# Patient Record
Sex: Female | Born: 1960
Health system: Southern US, Community
[De-identification: ages and names within clinical notes are randomized; demographics above are authoritative.]

## PROBLEM LIST (undated history)

## (undated) DIAGNOSIS — R112 Nausea with vomiting, unspecified: Secondary | ICD-10-CM

## (undated) DIAGNOSIS — F988 Other specified behavioral and emotional disorders with onset usually occurring in childhood and adolescence: Secondary | ICD-10-CM

## (undated) DIAGNOSIS — I10 Essential (primary) hypertension: Secondary | ICD-10-CM

## (undated) DIAGNOSIS — Z9889 Other specified postprocedural states: Secondary | ICD-10-CM

## (undated) DIAGNOSIS — R079 Chest pain, unspecified: Secondary | ICD-10-CM

## (undated) HISTORY — DX: Essential (primary) hypertension: I10

## (undated) HISTORY — DX: Chest pain, unspecified: R07.9

## (undated) HISTORY — DX: Other specified behavioral and emotional disorders with onset usually occurring in childhood and adolescence: F98.8

## (undated) HISTORY — PX: LAPAROSCOPY: SHX197

---
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
## 1997-11-13 ENCOUNTER — Other Ambulatory Visit: Admission: RE | Admit: 1997-11-13 | Discharge: 1997-11-13 | Payer: Self-pay | Admitting: Obstetrics and Gynecology

## 1998-09-23 ENCOUNTER — Ambulatory Visit (HOSPITAL_COMMUNITY): Admission: RE | Admit: 1998-09-23 | Discharge: 1998-09-23 | Payer: Self-pay | Admitting: Family Medicine

## 1998-09-23 ENCOUNTER — Encounter: Payer: Self-pay | Admitting: Family Medicine

## 1999-01-14 ENCOUNTER — Encounter: Payer: Self-pay | Admitting: *Deleted

## 1999-01-14 ENCOUNTER — Ambulatory Visit (HOSPITAL_COMMUNITY): Admission: RE | Admit: 1999-01-14 | Discharge: 1999-01-14 | Payer: Self-pay | Admitting: *Deleted

## 2004-05-09 ENCOUNTER — Ambulatory Visit (HOSPITAL_COMMUNITY): Admission: RE | Admit: 2004-05-09 | Discharge: 2004-05-09 | Payer: Self-pay | Admitting: Family Medicine

## 2008-08-16 ENCOUNTER — Encounter: Payer: Self-pay | Admitting: Cardiology

## 2013-03-30 ENCOUNTER — Other Ambulatory Visit: Payer: Self-pay | Admitting: Family Medicine

## 2013-03-30 ENCOUNTER — Ambulatory Visit
Admission: RE | Admit: 2013-03-30 | Discharge: 2013-03-30 | Disposition: A | Discharge status: Home / Self Care | Payer: Self-pay | Source: Ambulatory Visit | Attending: Family Medicine | Admitting: Family Medicine

## 2013-03-30 DIAGNOSIS — M25551 Pain in right hip: Secondary | ICD-10-CM

## 2013-04-27 ENCOUNTER — Encounter: Payer: Self-pay | Admitting: Internal Medicine

## 2013-06-29 ENCOUNTER — Ambulatory Visit (AMBULATORY_SURGERY_CENTER): Payer: Self-pay | Admitting: *Deleted

## 2013-06-29 ENCOUNTER — Telehealth: Payer: Self-pay | Admitting: Internal Medicine

## 2013-06-29 VITALS — Ht 70.0 in | Wt 154.4 lb

## 2013-06-29 DIAGNOSIS — Z1211 Encounter for screening for malignant neoplasm of colon: Secondary | ICD-10-CM

## 2013-06-29 MED ORDER — MOVIPREP 100 G PO SOLR: ORAL | Status: DC

## 2013-06-29 NOTE — Progress Notes (Signed)
No allergies to eggs or soy. No problems with anesthesia.  

## 2013-06-29 NOTE — Telephone Encounter (Signed)
Pharmacist is calling to tell us pt will have to pay $101.00 for MoviPrep.  Pt was aware at [facility] of cost of prep and was ok to use MoviPrep.  Pharmacist will fill Rx.

## 2013-07-13 ENCOUNTER — Ambulatory Visit (AMBULATORY_SURGERY_CENTER): Payer: PRIVATE HEALTH INSURANCE | Admitting: Internal Medicine

## 2013-07-13 ENCOUNTER — Encounter: Payer: Self-pay | Admitting: Internal Medicine

## 2013-07-13 VITALS — BP 121/70 | HR 72 | Temp 97.6°F | Resp 17 | Ht 70.0 in | Wt 154.0 lb

## 2013-07-13 DIAGNOSIS — Z1211 Encounter for screening for malignant neoplasm of colon: Secondary | ICD-10-CM

## 2013-07-13 MED ORDER — SODIUM CHLORIDE 0.9 % IV SOLN: 500.0000 mL | INTRAVENOUS | Status: DC

## 2013-07-13 NOTE — Patient Instructions (Signed)
YOU HAD AN ENDOSCOPIC PROCEDURE TODAY AT THE [facility]: Refer to the procedure report that was given to you for any specific questions about what was found during the examination.  If the procedure report does not answer your questions, please call your gastroenterologist to clarify.  If you requested that your care partner not be given the details of your procedure findings, then the procedure report has been included in a sealed envelope for you to review at your convenience later.  YOU SHOULD EXPECT: Some feelings of bloating in the abdomen. Passage of more gas than usual.  Walking can help get rid of the air that was put into your GI tract during the procedure and reduce the bloating. If you had a lower endoscopy (such as a colonoscopy or flexible sigmoidoscopy) you may notice spotting of blood in your stool or on the toilet paper. If you underwent a bowel prep for your procedure, then you may not have a normal bowel movement for a few days.  DIET: Your first meal following the procedure should be a light meal and then it is ok to progress to your normal diet.  A half-sandwich or bowl of soup is an example of a good first meal.  Heavy or fried foods are harder to digest and may make you feel nauseous or bloated.  Likewise meals heavy in dairy and vegetables can cause extra gas to form and this can also increase the bloating.  Drink plenty of fluids but you should avoid alcoholic beverages for 24 hours.  ACTIVITY: Your care partner should take you home directly after the procedure.  You should plan to take it easy, moving slowly for the rest of the day.  You can resume normal activity the day after the procedure however you should NOT DRIVE or use heavy machinery for 24 hours (because of the sedation medicines used during the test).    SYMPTOMS TO REPORT IMMEDIATELY: A gastroenterologist can be reached at any hour.  During normal business hours, 8:30 AM to 5:00 PM Monday through Friday,  call [phone].  After hours and on weekends, please call the GI answering service at [phone] who will take a message and have the physician on call contact you.   Following lower endoscopy (colonoscopy or flexible sigmoidoscopy):  Excessive amounts of blood in the stool  Significant tenderness or worsening of abdominal pains  Swelling of the abdomen that is new, acute  Fever of 100F or higher    FOLLOW UP: If any biopsies were taken you will be contacted by phone or by letter within the next 1-3 weeks.  Call your gastroenterologist if you have not heard about the biopsies in 3 weeks.  Our staff will call the home number listed on your records the next business day following your procedure to check on you and address any questions or concerns that you may have at that time regarding the information given to you following your procedure. This is a courtesy call and so if there is no answer at the home number and we have not heard from you through the emergency physician on call, we will assume that you have returned to your regular daily activities without incident.  SIGNATURES/CONFIDENTIALITY: You and/or your care partner have signed paperwork which will be entered into your electronic medical record.  These signatures attest to the fact that that the information above on your After Visit Summary has been reviewed and is understood.  Full responsibility of the confidentiality   this discharge information lies with you and/or your care-partner.   Information on hemorrhoids and high fiber diet given to you today 

## 2013-07-13 NOTE — Op Note (Signed)
[facility] [address] [place], [id number]   COLONOSCOPY PROCEDURE REPORT  PATIENT: Mary Rich, Mary S.  MR#: [id number] BIRTHDATE: [birth year] , 52  yrs. old GENDER: Female ENDOSCOPIST: Hart Carwinora M Brodie, MD REFERRED XB:[id number]:Cynthia Cliffton AstersWhite, M.D. PROCEDURE DATE:  07/13/2013 PROCEDURE:   Colonoscopy, screening First Screening Colonoscopy - Avg.  risk and is 50 yrs.  old or older Yes.  Prior Negative Screening - Now for repeat screening. N/A  History of Adenoma - Now for follow-up colonoscopy & has been > or = to 3 yrs.  N/A  Polyps Removed Today? No.  Recommend repeat exam, <10 yrs? No. ASA CLASS:   Class I INDICATIONS:Average risk patient for colon cancer. MEDICATIONS: MAC sedation, administered by CRNA and Propofol (Diprivan) 270 mg IV  DESCRIPTION OF PROCEDURE:   After the risks benefits and alternatives of the procedure were thoroughly explained, informed consent was obtained.  A digital rectal exam revealed no abnormalities of the rectum.   The LB CF-H180AL Loaner [id number] endoscope was introduced through the anus and advanced to the cecum, which was identified by both the appendix and ileocecal valve. No adverse events experienced.   The quality of the prep was good, using MoviPrep  The instrument was then slowly withdrawn as the colon was fully examined.      COLON FINDINGS: Small internal hemorrhoids were found.  Retroflexed views revealed no abnormalities. The time to cecum=4 minutes 23 seconds.  Withdrawal time=7 minutes 10 seconds.  The scope was withdrawn and the procedure completed. COMPLICATIONS: There were no complications.  ENDOSCOPIC IMPRESSION: Small internal hemorrhoids  RECOMMENDATIONS: high fiber diet  Recall colonoscopy in 10 years   eSigned:  Hart Carwinora M Brodie, MD 07/13/2013 9:22 AM   cc:   PATIENT NAME:  Mary Rich, Mary S. MR#: [id number]

## 2013-07-13 NOTE — Progress Notes (Signed)
Procedure ends, to recovery, report given and VSS. 

## 2013-07-16 ENCOUNTER — Telehealth: Payer: Self-pay | Admitting: *Deleted

## 2013-07-16 NOTE — Telephone Encounter (Signed)
  Follow up Call-  Call back number 07/13/2013  Post procedure Call Back phone  # [phone]  Permission to leave phone message Yes     Patient questions:  Do you have a fever, pain , or abdominal swelling? no Pain Score  0 *  Have you tolerated food without any problems? yes  Have you been able to return to your normal activities? yes  Do you have any questions about your discharge instructions: Diet   no Medications  no Follow up visit  no  Do you have questions or concerns about your Care? no  Actions: * If pain score is 4 or above: No action needed, pain <4.

## 2015-02-11 ENCOUNTER — Other Ambulatory Visit (HOSPITAL_COMMUNITY): Payer: PRIVATE HEALTH INSURANCE

## 2015-02-18 ENCOUNTER — Encounter (HOSPITAL_COMMUNITY): Admission: RE | Payer: Self-pay | Source: Ambulatory Visit

## 2015-02-18 ENCOUNTER — Inpatient Hospital Stay (HOSPITAL_COMMUNITY): Admission: RE | Admit: 2015-02-18 | Payer: 59 | Source: Ambulatory Visit | Admitting: Orthopedic Surgery

## 2015-02-18 SURGERY — ARTHROPLASTY, HIP, TOTAL, ANTERIOR APPROACH
Anesthesia: Spinal | Laterality: Right

## 2017-04-27 NOTE — H&P (Signed)
TOTAL HIP ADMISSION H&P  Patient is admitted for right total hip arthroplasty, anterior approach.  Subjective:  Chief Complaint: Right hip primary OA / pain  HPI: Mary Rich, 57 y.o. female, has a history of pain and functional disability in the right hip(s) due to arthritis and patient has failed non-surgical conservative treatments for greater than 12 weeks to include NSAID's and/or analgesics, corticosteriod injections and activity modification.  Onset of symptoms was gradual starting ~5 years ago with gradually worsening course since that time.The patient noted no past surgery on the right hip(s).  Patient currently rates pain in the right hip at 9 out of 10 with activity. Patient has night pain, worsening of pain with activity and weight bearing, trendelenberg gait, pain that interfers with activities of daily living and pain with passive range of motion. Patient has evidence of periarticular osteophytes and joint space narrowing by imaging studies. This condition presents safety issues increasing the risk of falls. There is no current active infection.   Risks, benefits and expectations were discussed with the patient.  Risks including but not limited to the risk of anesthesia, blood clots, nerve damage, blood vessel damage, failure of the prosthesis, infection and up to and including death.  Patient understand the risks, benefits and expectations and wishes to proceed with surgery.   PCP: Mary Rich, Cynthia, MD  D/C Plans:       Home   Post-op Meds:       No Rx given  Tranexamic Acid:      To be given - IV   Decadron:      Is to be given  FYI:     ASA  Norco 5/325  DME:   Rx given for - RW   PT:    No PT    Past Medical History:  Diagnosis Date  . Attention deficit disorder   . Chest pain   . Hypertension     Past Surgical History:  Procedure Laterality Date  . WISDOM TOOTH EXTRACTION  1982   x4    No current facility-administered medications for this encounter.     Current Outpatient Medications  Medication Sig Dispense Refill Last Dose  . amphetamine-dextroamphetamine (ADDERALL) 30 MG tablet Take 30 mg by mouth daily.   07/12/2013  . aspirin 81 MG tablet Take 81 mg by mouth daily.   07/12/2013  . calcium carbonate (OS-CAL) 1250 MG chewable tablet Chew 1 tablet by mouth daily.   07/12/2013  . ECHINACEA PO Take by mouth daily.   Not Taking  . lisinopril (PRINIVIL,ZESTRIL) 5 MG tablet Take 5 mg by mouth daily.   07/13/2013  . Multiple Vitamin (MULTIVITAMIN) tablet Take 1 tablet by mouth daily.   07/12/2013   Allergies  Allergen Reactions  . Penicillins Hives    Social History   Tobacco Use  . Smoking status: Never Smoker  . Smokeless tobacco: Never Used  Substance Use Topics  . Alcohol use: No    Family History  Problem Relation Age of Onset  . Diabetes Mother   . Hypertension Mother   . Stroke Maternal Grandfather   . Colon cancer Neg Hx   . Stomach cancer Neg Hx      Review of Systems  Constitutional: Negative.   HENT: Negative.   Eyes: Negative.   Respiratory: Negative.   Cardiovascular: Negative.   Gastrointestinal: Negative.   Genitourinary: Negative.   Musculoskeletal: Positive for joint pain.  Skin: Negative.   Neurological: Negative.   Endo/Heme/Allergies: Negative.  Psychiatric/Behavioral: Negative.     Objective:  Physical Exam  Constitutional: She is oriented to person, place, and time. She appears well-developed.  HENT:  Head: Normocephalic.  Eyes: Pupils are equal, round, and reactive to light.  Neck: Neck supple. No JVD present. No tracheal deviation present. No thyromegaly present.  Cardiovascular: Normal rate, regular rhythm and intact distal pulses.  No murmur heard. Respiratory: Effort normal and breath sounds normal. No respiratory distress. She has no wheezes.  GI: Soft. There is no tenderness. There is no guarding.  Musculoskeletal:       Right hip: She exhibits decreased range of motion, decreased  strength, tenderness and bony tenderness. She exhibits no swelling, no deformity and no laceration.  Lymphadenopathy:    She has no cervical adenopathy.  Neurological: She is alert and oriented to person, place, and time.  Skin: Skin is warm and dry.  Psychiatric: She has a normal mood and affect.      Labs:  Estimated body mass index is 22.1 kg/m as calculated from the following:   Height as of 07/13/13: 5\' 10"  (1.778 m).   Weight as of 07/13/13: 69.9 kg (154 lb).   Imaging Review Plain radiographs demonstrate severe degenerative joint disease of the right hip(s). The bone quality appears to be good for age and reported activity level.  Assessment/Plan:  End stage arthritis, right hip(s)  The patient history, physical examination, clinical judgement of the provider and imaging studies are consistent with end stage degenerative joint disease of the right hip(s) and total hip arthroplasty is deemed medically necessary. The treatment options including medical management, injection therapy, arthroscopy and arthroplasty were discussed at length. The risks and benefits of total hip arthroplasty were presented and reviewed. The risks due to aseptic loosening, infection, stiffness, dislocation/subluxation,  thromboembolic complications and other imponderables were discussed.  The patient acknowledged the explanation, agreed to proceed with the plan and consent was signed. Patient is being admitted for inpatient treatment for surgery, pain control, PT, OT, prophylactic antibiotics, VTE prophylaxis, progressive ambulation and ADL's and discharge planning.The patient is planning to be discharged home.     Mary Rich Mary [place]   PA-C  04/27/2017, 12:48 PM

## 2017-05-02 ENCOUNTER — Other Ambulatory Visit (HOSPITAL_COMMUNITY): Payer: Self-pay | Admitting: Emergency Medicine

## 2017-05-02 NOTE — Progress Notes (Signed)
CLEARANCE DR WHITE 02-04-17 ON CHART

## 2017-05-02 NOTE — Patient Instructions (Signed)
Mary Rich  05/02/2017   Your procedure is scheduled on: 05-10-17  [facility]    Report to admitting at 6AM   Call this number if you have problems the morning of surgery [phone]     Remember: Do not eat food or drink liquids :After Midnight.     Take these medicines the morning of surgery with A SIP OF WATER: none                                You may not have any metal on your body including hair pins and              piercings  Do not wear jewelry, make-up, lotions, powders or perfumes, deodorant             Do not wear nail polish.  Do not shave  48 hours prior to surgery.     Do not bring valuables to the hospital. [facility] IS NOT             RESPONSIBLE   FOR VALUABLES.  Contacts, dentures or bridgework may not be worn into surgery.  Leave suitcase in the car. After surgery it may be brought to your room.                Please read over the following fact sheets you were given: _____________________________________________________________________             [facility] - Preparing for [facility] Before surgery, you can play an important role.  Because skin is not sterile, your skin needs to be as free of germs as possible.  You can reduce the number of germs on your skin by washing with CHG (chlorahexidine gluconate) soap before surgery.  CHG is an antiseptic cleaner which kills germs and bonds with the skin to continue killing germs even after washing. Please DO NOT use if you have an allergy to CHG or antibacterial soaps.  If your skin becomes reddened/irritated stop using the CHG and inform your nurse when you arrive at Short Stay. Do not shave (including legs and underarms) for at least 48 hours prior to the first CHG shower.  You may shave your face/neck. Please follow these instructions carefully:  1.  Shower with CHG Soap the night before surgery and the  morning of Surgery.  2.  If you choose to wash  your hair, wash your hair first as usual with your  normal  shampoo.  3.  After you shampoo, rinse your hair and body thoroughly to remove the  shampoo.                           4.  Use CHG as you would any other liquid soap.  You can apply chg directly  to the skin and wash                       Gently with a scrungie or clean washcloth.  5.  Apply the CHG Soap to your body ONLY FROM THE NECK DOWN.   Do not use on face/ open  Wound or open sores. Avoid contact with eyes, ears mouth and genitals (private parts).                       Wash face,  Genitals (private parts) with your normal soap.             6.  Wash thoroughly, paying special attention to the area where your surgery  will be performed.  7.  Thoroughly rinse your body with warm water from the neck down.  8.  DO NOT shower/wash with your normal soap after using and rinsing off  the CHG Soap.                9.  Pat yourself dry with a clean towel.            10.  Wear clean pajamas.            11.  Place clean sheets on your bed the night of your first shower and do not  sleep with pets. Day of Surgery : Do not apply any lotions/deodorants the morning of surgery.  Please wear clean clothes to the hospital/surgery center.  FAILURE TO FOLLOW THESE INSTRUCTIONS MAY RESULT IN THE CANCELLATION OF YOUR SURGERY PATIENT SIGNATURE_________________________________  NURSE SIGNATURE__________________________________  ________________________________________________________________________   Mary Rich  An incentive spirometer is a tool that can help keep your lungs clear and active. This tool measures how well you are filling your lungs with each breath. Taking long deep breaths may help reverse or decrease the chance of developing breathing (pulmonary) problems (especially infection) following:  A long period of time when you are unable to move or be active. BEFORE THE PROCEDURE   If the spirometer  includes an indicator to show your best effort, your nurse or respiratory therapist will set it to a desired goal.  If possible, sit up straight or lean slightly forward. Try not to slouch.  Hold the incentive spirometer in an upright position. INSTRUCTIONS FOR USE  1. Sit on the edge of your bed if possible, or sit up as far as you can in bed or on a chair. 2. Hold the incentive spirometer in an upright position. 3. Breathe out normally. 4. Place the mouthpiece in your mouth and seal your lips tightly around it. 5. Breathe in slowly and as deeply as possible, raising the piston or the ball toward the top of the column. 6. Hold your breath for 3-5 seconds or for as long as possible. Allow the piston or ball to fall to the bottom of the column. 7. Remove the mouthpiece from your mouth and breathe out normally. 8. Rest for a few seconds and repeat Steps 1 through 7 at least 10 times every 1-2 hours when you are awake. Take your time and take a few normal breaths between deep breaths. 9. The spirometer may include an indicator to show your best effort. Use the indicator as a goal to work toward during each repetition. 10. After each set of 10 deep breaths, practice coughing to be sure your lungs are clear. If you have an incision (the cut made at the time of surgery), support your incision when coughing by placing a pillow or rolled up towels firmly against it. Once you are able to get out of bed, walk around indoors and cough well. You may stop using the incentive spirometer when instructed by your caregiver.  RISKS AND COMPLICATIONS  Take your time so you do not get  dizzy or light-headed.  If you are in pain, you may need to take or ask for pain medication before doing incentive spirometry. It is harder to take a deep breath if you are having pain. AFTER USE  Rest and breathe slowly and easily.  It can be helpful to keep track of a log of your progress. Your caregiver can provide you with a  simple table to help with this. If you are using the spirometer at home, follow these instructions: Mary Rich IF:   You are having difficultly using the spirometer.  You have trouble using the spirometer as often as instructed.  Your pain medication is not giving enough relief while using the spirometer.  You develop fever of 100.5 F (38.1 C) or higher. SEEK IMMEDIATE MEDICAL CARE IF:   You cough up bloody sputum that had not been present before.  You develop fever of 102 F (38.9 C) or greater.  You develop worsening pain at or near the incision site. MAKE SURE YOU:   Understand these instructions.  Will watch your condition.  Will get help right away if you are not doing well or get worse. Document Released: 08/16/2006 Document Revised: 06/28/2011 Document Reviewed: 10/17/2006 Exit[employer] Patient Information 2014 [employer], [place].   ________________________________________________________________________  WHAT IS A BLOOD TRANSFUSION? Blood Transfusion Information  A transfusion is the replacement of blood or some of its parts. Blood is made up of multiple cells which provide different functions.  Red blood cells carry oxygen and are used for blood loss replacement.  White blood cells fight against infection.  Platelets control bleeding.  Plasma helps clot blood.  Other blood products are available for specialized needs, such as hemophilia or other clotting disorders. BEFORE THE TRANSFUSION  Who gives blood for transfusions?   Healthy volunteers who are fully evaluated to make sure their blood is safe. This is blood bank blood. Transfusion therapy is the safest it has ever been in the practice of medicine. Before blood is taken from a donor, a complete history is taken to make sure that person has no history of diseases nor engages in risky social behavior (examples are intravenous drug use or sexual activity with multiple partners). The donor's travel history  is screened to minimize risk of transmitting infections, such as malaria. The donated blood is tested for signs of infectious diseases, such as HIV and hepatitis. The blood is then tested to be sure it is compatible with you in order to minimize the chance of a transfusion reaction. If you or a relative donates blood, this is often done in anticipation of surgery and is not appropriate for emergency situations. It takes many days to process the donated blood. RISKS AND COMPLICATIONS Although transfusion therapy is very safe and saves many lives, the main dangers of transfusion include:   Getting an infectious disease.  Developing a transfusion reaction. This is an allergic reaction to something in the blood you were given. Every precaution is taken to prevent this. The decision to have a blood transfusion has been considered carefully by your caregiver before blood is given. Blood is not given unless the benefits outweigh the risks. AFTER THE TRANSFUSION  Right after receiving a blood transfusion, you will usually feel much better and more energetic. This is especially true if your red blood cells have gotten low (anemic). The transfusion raises the level of the red blood cells which carry oxygen, and this usually causes an energy increase.  The nurse administering the transfusion will  monitor you carefully for complications. HOME CARE INSTRUCTIONS  No special instructions are needed after a transfusion. You may find your energy is better. Speak with your caregiver about any limitations on activity for underlying diseases you may have. SEEK MEDICAL CARE IF:   Your condition is not improving after your transfusion.  You develop redness or irritation at the intravenous (IV) site. SEEK IMMEDIATE MEDICAL CARE IF:  Any of the following symptoms occur over the next 12 hours:  Shaking chills.  You have a temperature by mouth above 102 F (38.9 C), not controlled by medicine.  Chest, back, or  muscle pain.  People around you feel you are not acting correctly or are confused.  Shortness of breath or difficulty breathing.  Dizziness and fainting.  You get a rash or develop hives.  You have a decrease in urine output.  Your urine turns a dark color or changes to pink, red, or brown. Any of the following symptoms occur over the next 10 days:  You have a temperature by mouth above 102 F (38.9 C), not controlled by medicine.  Shortness of breath.  Weakness after normal activity.  The white part of the eye turns yellow (jaundice).  You have a decrease in the amount of urine or are urinating less often.  Your urine turns a dark color or changes to pink, red, or brown. Document Released: 04/02/2000 Document Revised: 06/28/2011 Document Reviewed: 11/20/2007 [facility] Patient Information 2014 [place], [place].  _______________________________________________________________________

## 2017-05-05 ENCOUNTER — Other Ambulatory Visit: Payer: Self-pay

## 2017-05-05 ENCOUNTER — Encounter (HOSPITAL_COMMUNITY): Payer: Self-pay | Admitting: Emergency Medicine

## 2017-05-05 ENCOUNTER — Encounter (HOSPITAL_COMMUNITY)
Admission: RE | Admit: 2017-05-05 | Discharge: 2017-05-05 | Disposition: A | Discharge status: Home / Self Care | Payer: 59 | Source: Ambulatory Visit | Attending: Orthopedic Surgery | Admitting: Orthopedic Surgery

## 2017-05-05 DIAGNOSIS — Z0183 Encounter for blood typing: Secondary | ICD-10-CM | POA: Insufficient documentation

## 2017-05-05 DIAGNOSIS — M1611 Unilateral primary osteoarthritis, right hip: Secondary | ICD-10-CM | POA: Insufficient documentation

## 2017-05-05 DIAGNOSIS — Z01812 Encounter for preprocedural laboratory examination: Secondary | ICD-10-CM | POA: Diagnosis not present

## 2017-05-05 DIAGNOSIS — Z01818 Encounter for other preprocedural examination: Secondary | ICD-10-CM | POA: Insufficient documentation

## 2017-05-05 DIAGNOSIS — I10 Essential (primary) hypertension: Secondary | ICD-10-CM | POA: Diagnosis not present

## 2017-05-05 HISTORY — DX: Other specified postprocedural states: R11.2

## 2017-05-05 HISTORY — DX: Other specified postprocedural states: Z98.890

## 2017-05-05 LAB — CBC
HCT: 39.4 % (ref 36.0–46.0)
HEMOGLOBIN: 13.4 g/dL (ref 12.0–15.0)
MCH: 32.5 pg (ref 26.0–34.0)
MCHC: 34 g/dL (ref 30.0–36.0)
MCV: 95.6 fL (ref 78.0–100.0)
Platelets: 246 10*3/uL (ref 150–400)
RBC: 4.12 MIL/uL (ref 3.87–5.11)
RDW: 13.4 % (ref 11.5–15.5)
WBC: 7.3 10*3/uL (ref 4.0–10.5)

## 2017-05-05 LAB — ABO/RH: ABO/RH(D): O POS

## 2017-05-05 LAB — BASIC METABOLIC PANEL
Anion gap: 4 — ABNORMAL LOW (ref 5–15)
BUN: 23 mg/dL — ABNORMAL HIGH (ref 6–20)
CALCIUM: 9.8 mg/dL (ref 8.9–10.3)
CHLORIDE: 105 mmol/L (ref 101–111)
CO2: 30 mmol/L (ref 22–32)
CREATININE: 0.85 mg/dL (ref 0.44–1.00)
Glucose, Bld: 94 mg/dL (ref 65–99)
Potassium: 4.9 mmol/L (ref 3.5–5.1)
SODIUM: 139 mmol/L (ref 135–145)

## 2017-05-05 LAB — SURGICAL PCR SCREEN
MRSA, PCR: NEGATIVE
Staphylococcus aureus: POSITIVE — AB

## 2017-05-09 MED ORDER — SODIUM CHLORIDE 0.9 % IV SOLN
1000.0000 mg | INTRAVENOUS | Status: AC
  Administered 2017-05-10: 1000 mg via INTRAVENOUS
  Filled 2017-05-09: qty 1100

## 2017-05-09 NOTE — Anesthesia Preprocedure Evaluation (Addendum)
Anesthesia Evaluation  Patient identified by MRN, date of birth, ID band Patient awake    Reviewed: Allergy & Precautions, NPO status , Patient's Chart, lab work & pertinent test results  History of Anesthesia Complications (+) PONV  Airway Mallampati: II  TM Distance: >3 FB Neck ROM: Full    Dental no notable dental hx.    Pulmonary neg pulmonary ROS,    Pulmonary exam normal breath sounds clear to auscultation       Cardiovascular Exercise Tolerance: Good hypertension, negative cardio ROS Normal cardiovascular exam Rhythm:Regular Rate:Normal     Neuro/Psych negative neurological ROS  negative psych ROS   GI/Hepatic negative GI ROS, Neg liver ROS,   Endo/Other  negative endocrine ROS  Renal/GU negative Renal ROS  negative genitourinary   Musculoskeletal negative musculoskeletal ROS (+)   Abdominal   Peds  Hematology negative hematology ROS (+)   Anesthesia Other Findings   Reproductive/Obstetrics                            Anesthesia Physical Anesthesia Plan  ASA: II  Anesthesia Plan: Spinal   Post-op Pain Management:    Induction:   PONV Risk Score and Plan: Treatment may vary due to age or medical condition, Ondansetron and Midazolam  Airway Management Planned: Mask, Natural Airway and Nasal Cannula  Additional Equipment:   Intra-op Plan:   Post-operative Plan:   Informed Consent: I have reviewed the patients History and Physical, chart, labs and discussed the procedure including the risks, benefits and alternatives for the proposed anesthesia with the patient or authorized representative who has indicated his/her understanding and acceptance.     Plan Discussed with: CRNA and Anesthesiologist  Anesthesia Plan Comments:        Lab Results  Component Value Date   WBC 7.3 05/05/2017   HGB 13.4 05/05/2017   HCT 39.4 05/05/2017   MCV 95.6 05/05/2017   PLT 246  05/05/2017   No results found for: INR, PROTIME  Anesthesia Quick Evaluation

## 2017-05-10 ENCOUNTER — Inpatient Hospital Stay (HOSPITAL_COMMUNITY): Payer: 59

## 2017-05-10 ENCOUNTER — Inpatient Hospital Stay (HOSPITAL_COMMUNITY)
Admission: RE | Admit: 2017-05-10 | Discharge: 2017-05-11 | DRG: 470 | Disposition: A | Discharge status: Home / Self Care | Payer: 59 | Source: Ambulatory Visit | Attending: Orthopedic Surgery | Admitting: Orthopedic Surgery

## 2017-05-10 ENCOUNTER — Other Ambulatory Visit: Payer: Self-pay

## 2017-05-10 ENCOUNTER — Encounter (HOSPITAL_COMMUNITY)
Admission: RE | Disposition: A | Discharge status: Home / Self Care | Payer: Self-pay | Source: Ambulatory Visit | Attending: Orthopedic Surgery

## 2017-05-10 ENCOUNTER — Encounter (HOSPITAL_COMMUNITY): Payer: Self-pay | Admitting: Certified Registered"

## 2017-05-10 ENCOUNTER — Inpatient Hospital Stay (HOSPITAL_COMMUNITY): Payer: 59 | Admitting: Anesthesiology

## 2017-05-10 DIAGNOSIS — Z79899 Other long term (current) drug therapy: Secondary | ICD-10-CM

## 2017-05-10 DIAGNOSIS — Z96649 Presence of unspecified artificial hip joint: Secondary | ICD-10-CM

## 2017-05-10 DIAGNOSIS — Z880 Allergy status to penicillin: Secondary | ICD-10-CM

## 2017-05-10 DIAGNOSIS — M1631 Unilateral osteoarthritis resulting from hip dysplasia, right hip: Secondary | ICD-10-CM | POA: Diagnosis present

## 2017-05-10 DIAGNOSIS — I10 Essential (primary) hypertension: Secondary | ICD-10-CM | POA: Diagnosis present

## 2017-05-10 DIAGNOSIS — Z7982 Long term (current) use of aspirin: Secondary | ICD-10-CM | POA: Diagnosis not present

## 2017-05-10 DIAGNOSIS — M1611 Unilateral primary osteoarthritis, right hip: Secondary | ICD-10-CM | POA: Diagnosis present

## 2017-05-10 DIAGNOSIS — Z96641 Presence of right artificial hip joint: Secondary | ICD-10-CM

## 2017-05-10 HISTORY — PX: TOTAL HIP ARTHROPLASTY: SHX124

## 2017-05-10 LAB — TYPE AND SCREEN
ABO/RH(D): O POS
ANTIBODY SCREEN: NEGATIVE

## 2017-05-10 SURGERY — ARTHROPLASTY, HIP, TOTAL, ANTERIOR APPROACH
Anesthesia: Spinal | Site: Hip | Laterality: Right

## 2017-05-10 MED ORDER — DOCUSATE SODIUM 100 MG PO CAPS
100.0000 mg | ORAL_CAPSULE | Freq: Two times a day (BID) | ORAL | Status: DC
  Administered 2017-05-10 – 2017-05-11 (×2): 100 mg via ORAL
  Filled 2017-05-10 (×2): qty 1

## 2017-05-10 MED ORDER — PROPOFOL 10 MG/ML IV BOLUS
INTRAVENOUS | Status: AC
  Filled 2017-05-10: qty 20

## 2017-05-10 MED ORDER — ONDANSETRON HCL 4 MG/2ML IJ SOLN
4.0000 mg | Freq: Three times a day (TID) | INTRAMUSCULAR | Status: DC | PRN
  Administered 2017-05-10: 4 mg via INTRAVENOUS
  Filled 2017-05-10: qty 2

## 2017-05-10 MED ORDER — FENTANYL CITRATE (PF) 100 MCG/2ML IJ SOLN
INTRAMUSCULAR | Status: AC
  Filled 2017-05-10: qty 2

## 2017-05-10 MED ORDER — MAGNESIUM CITRATE PO SOLN: 1.0000 | Freq: Once | ORAL | Status: DC | PRN

## 2017-05-10 MED ORDER — ACETAMINOPHEN 650 MG RE SUPP: 650.0000 mg | RECTAL | Status: DC | PRN

## 2017-05-10 MED ORDER — HYDROCODONE-ACETAMINOPHEN 7.5-325 MG PO TABS: 1.0000 | ORAL_TABLET | Freq: Once | ORAL | Status: DC | PRN

## 2017-05-10 MED ORDER — POLYETHYLENE GLYCOL 3350 17 G PO PACK: 17.0000 g | PACK | Freq: Two times a day (BID) | ORAL | Status: DC

## 2017-05-10 MED ORDER — DEXTROSE 5 % IV SOLN
5.0000 mg/kg | Freq: Once | INTRAVENOUS | Status: AC
  Administered 2017-05-10: 356 mg via INTRAVENOUS
  Filled 2017-05-10: qty 9

## 2017-05-10 MED ORDER — METOCLOPRAMIDE HCL 5 MG/ML IJ SOLN
5.0000 mg | Freq: Three times a day (TID) | INTRAMUSCULAR | Status: DC | PRN
  Administered 2017-05-10: 10 mg via INTRAVENOUS
  Filled 2017-05-10: qty 2

## 2017-05-10 MED ORDER — LACTATED RINGERS IV SOLN
INTRAVENOUS | Status: DC
  Administered 2017-05-10 (×2): via INTRAVENOUS

## 2017-05-10 MED ORDER — HYDROCODONE-ACETAMINOPHEN 5-325 MG PO TABS: 2.0000 | ORAL_TABLET | ORAL | Status: DC | PRN

## 2017-05-10 MED ORDER — VANCOMYCIN HCL IN DEXTROSE 1-5 GM/200ML-% IV SOLN
1000.0000 mg | Freq: Two times a day (BID) | INTRAVENOUS | Status: AC
  Administered 2017-05-10: 21:00:00 1000 mg via INTRAVENOUS
  Filled 2017-05-10: qty 200

## 2017-05-10 MED ORDER — MENTHOL 3 MG MT LOZG: 1.0000 | LOZENGE | OROMUCOSAL | Status: DC | PRN

## 2017-05-10 MED ORDER — FERROUS SULFATE 325 (65 FE) MG PO TABS: 325.0000 mg | ORAL_TABLET | Freq: Three times a day (TID) | ORAL | Status: DC

## 2017-05-10 MED ORDER — HYDROMORPHONE HCL 1 MG/ML IJ SOLN
0.2500 mg | INTRAMUSCULAR | Status: DC | PRN
  Administered 2017-05-10 (×2): 0.5 mg via INTRAVENOUS

## 2017-05-10 MED ORDER — POLYETHYLENE GLYCOL 3350 17 G PO PACK: 17.0000 g | PACK | Freq: Two times a day (BID) | ORAL | 0 refills | Status: DC

## 2017-05-10 MED ORDER — ALUM & MAG HYDROXIDE-SIMETH 200-200-20 MG/5ML PO SUSP: 15.0000 mL | ORAL | Status: DC | PRN

## 2017-05-10 MED ORDER — HYDROMORPHONE HCL 1 MG/ML IJ SOLN
INTRAMUSCULAR | Status: AC
  Administered 2017-05-10: 0.5 mg via INTRAVENOUS
  Filled 2017-05-10: qty 1

## 2017-05-10 MED ORDER — BISACODYL 10 MG RE SUPP: 10.0000 mg | Freq: Every day | RECTAL | Status: DC | PRN

## 2017-05-10 MED ORDER — ONDANSETRON HCL 4 MG/2ML IJ SOLN: 4.0000 mg | Freq: Four times a day (QID) | INTRAMUSCULAR | Status: DC | PRN

## 2017-05-10 MED ORDER — PROMETHAZINE HCL 25 MG/ML IJ SOLN
6.2500 mg | Freq: Four times a day (QID) | INTRAMUSCULAR | Status: DC | PRN
  Administered 2017-05-10: 12.5 mg via INTRAVENOUS
  Filled 2017-05-10: qty 1

## 2017-05-10 MED ORDER — DIPHENHYDRAMINE HCL 12.5 MG/5ML PO ELIX: 12.5000 mg | ORAL_SOLUTION | ORAL | Status: DC | PRN

## 2017-05-10 MED ORDER — MEPERIDINE HCL 50 MG/ML IJ SOLN: 6.2500 mg | INTRAMUSCULAR | Status: DC | PRN

## 2017-05-10 MED ORDER — BUPIVACAINE IN DEXTROSE 0.75-8.25 % IT SOLN
INTRATHECAL | Status: DC | PRN
  Administered 2017-05-10: 2 mL via INTRATHECAL

## 2017-05-10 MED ORDER — CELECOXIB 200 MG PO CAPS
200.0000 mg | ORAL_CAPSULE | Freq: Two times a day (BID) | ORAL | Status: DC
  Administered 2017-05-10 – 2017-05-11 (×2): 200 mg via ORAL
  Filled 2017-05-10 (×2): qty 1

## 2017-05-10 MED ORDER — DEXAMETHASONE SODIUM PHOSPHATE 10 MG/ML IJ SOLN
10.0000 mg | Freq: Once | INTRAMUSCULAR | Status: AC
  Administered 2017-05-11: 10 mg via INTRAVENOUS
  Filled 2017-05-10: qty 1

## 2017-05-10 MED ORDER — LIDOCAINE 2% (20 MG/ML) 5 ML SYRINGE
INTRAMUSCULAR | Status: AC
  Filled 2017-05-10: qty 5

## 2017-05-10 MED ORDER — SODIUM CHLORIDE 0.9 % IV SOLN
INTRAVENOUS | Status: DC
  Administered 2017-05-10 – 2017-05-11 (×2): via INTRAVENOUS

## 2017-05-10 MED ORDER — PHENOL 1.4 % MT LIQD
1.0000 | OROMUCOSAL | Status: DC | PRN
  Filled 2017-05-10: qty 177

## 2017-05-10 MED ORDER — ONDANSETRON HCL 4 MG PO TABS: 4.0000 mg | ORAL_TABLET | Freq: Four times a day (QID) | ORAL | Status: DC | PRN

## 2017-05-10 MED ORDER — FERROUS SULFATE 325 (65 FE) MG PO TABS: 325.0000 mg | ORAL_TABLET | Freq: Three times a day (TID) | ORAL | 3 refills | Status: DC

## 2017-05-10 MED ORDER — STERILE WATER FOR IRRIGATION IR SOLN
Status: DC | PRN
  Administered 2017-05-10: 2000 mL

## 2017-05-10 MED ORDER — HYDROMORPHONE HCL 1 MG/ML IJ SOLN
0.5000 mg | INTRAMUSCULAR | Status: DC | PRN
  Administered 2017-05-10: 18:00:00 0.5 mg via INTRAVENOUS
  Filled 2017-05-10 (×2): qty 1

## 2017-05-10 MED ORDER — AMPHETAMINE-DEXTROAMPHET ER 20 MG PO CP24: 30.0000 mg | ORAL_CAPSULE | Freq: Every day | ORAL | Status: DC

## 2017-05-10 MED ORDER — ACETAMINOPHEN 10 MG/ML IV SOLN
INTRAVENOUS | Status: AC
  Administered 2017-05-10: 1000 mg via INTRAVENOUS
  Filled 2017-05-10: qty 100

## 2017-05-10 MED ORDER — METHOCARBAMOL 500 MG PO TABS
500.0000 mg | ORAL_TABLET | Freq: Four times a day (QID) | ORAL | Status: DC | PRN
  Administered 2017-05-10: 500 mg via ORAL
  Filled 2017-05-10: qty 1

## 2017-05-10 MED ORDER — ONDANSETRON HCL 4 MG PO TABS: 4.0000 mg | ORAL_TABLET | Freq: Three times a day (TID) | ORAL | Status: DC | PRN

## 2017-05-10 MED ORDER — DEXAMETHASONE SODIUM PHOSPHATE 10 MG/ML IJ SOLN
10.0000 mg | Freq: Once | INTRAMUSCULAR | Status: AC
  Administered 2017-05-10: 10 mg via INTRAVENOUS

## 2017-05-10 MED ORDER — FENTANYL CITRATE (PF) 100 MCG/2ML IJ SOLN
INTRAMUSCULAR | Status: DC | PRN
  Administered 2017-05-10: 100 ug via INTRAVENOUS

## 2017-05-10 MED ORDER — CHLORHEXIDINE GLUCONATE 4 % EX LIQD: 60.0000 mL | Freq: Once | CUTANEOUS | Status: DC

## 2017-05-10 MED ORDER — PHENYLEPHRINE 40 MCG/ML (10ML) SYRINGE FOR IV PUSH (FOR BLOOD PRESSURE SUPPORT)
PREFILLED_SYRINGE | INTRAVENOUS | Status: AC
  Filled 2017-05-10: qty 10

## 2017-05-10 MED ORDER — LIDOCAINE 2% (20 MG/ML) 5 ML SYRINGE
INTRAMUSCULAR | Status: DC | PRN
  Administered 2017-05-10: 100 mg via INTRAVENOUS

## 2017-05-10 MED ORDER — HYDROCODONE-ACETAMINOPHEN 5-325 MG PO TABS: 1.0000 | ORAL_TABLET | ORAL | 0 refills | Status: DC | PRN

## 2017-05-10 MED ORDER — ACETAMINOPHEN 325 MG PO TABS: 650.0000 mg | ORAL_TABLET | ORAL | Status: DC | PRN

## 2017-05-10 MED ORDER — METOCLOPRAMIDE HCL 5 MG PO TABS: 5.0000 mg | ORAL_TABLET | Freq: Three times a day (TID) | ORAL | Status: DC | PRN

## 2017-05-10 MED ORDER — METHOCARBAMOL 1000 MG/10ML IJ SOLN
500.0000 mg | Freq: Four times a day (QID) | INTRAVENOUS | Status: DC | PRN
  Administered 2017-05-10: 500 mg via INTRAVENOUS
  Filled 2017-05-10: qty 550

## 2017-05-10 MED ORDER — PROPOFOL 500 MG/50ML IV EMUL
INTRAVENOUS | Status: DC | PRN
  Administered 2017-05-10: 75 ug/kg/min via INTRAVENOUS

## 2017-05-10 MED ORDER — HYDROCODONE-ACETAMINOPHEN 5-325 MG PO TABS
1.0000 | ORAL_TABLET | ORAL | Status: DC | PRN
  Administered 2017-05-10 – 2017-05-11 (×2): 1 via ORAL
  Filled 2017-05-10 (×2): qty 1

## 2017-05-10 MED ORDER — TRANEXAMIC ACID 1000 MG/10ML IV SOLN
1000.0000 mg | Freq: Once | INTRAVENOUS | Status: AC
  Administered 2017-05-10: 13:00:00 1000 mg via INTRAVENOUS
  Filled 2017-05-10: qty 1100

## 2017-05-10 MED ORDER — MIDAZOLAM HCL 2 MG/2ML IJ SOLN
INTRAMUSCULAR | Status: AC
  Filled 2017-05-10: qty 2

## 2017-05-10 MED ORDER — DOCUSATE SODIUM 100 MG PO CAPS: 100.0000 mg | ORAL_CAPSULE | Freq: Two times a day (BID) | ORAL | 0 refills | Status: DC

## 2017-05-10 MED ORDER — SODIUM CHLORIDE 0.9 % IR SOLN
Status: DC | PRN
  Administered 2017-05-10: 1000 mL

## 2017-05-10 MED ORDER — PROMETHAZINE HCL 25 MG/ML IJ SOLN: 6.2500 mg | INTRAMUSCULAR | Status: DC | PRN

## 2017-05-10 MED ORDER — METHOCARBAMOL 500 MG PO TABS: 500.0000 mg | ORAL_TABLET | Freq: Four times a day (QID) | ORAL | 0 refills | Status: DC | PRN

## 2017-05-10 MED ORDER — ACETAMINOPHEN 10 MG/ML IV SOLN
1000.0000 mg | Freq: Once | INTRAVENOUS | Status: DC | PRN
  Administered 2017-05-10: 1000 mg via INTRAVENOUS

## 2017-05-10 MED ORDER — AMPHETAMINE-DEXTROAMPHETAMINE 10 MG PO TABS: 10.0000 mg | ORAL_TABLET | Freq: Every day | ORAL | Status: DC | PRN

## 2017-05-10 MED ORDER — PHENYLEPHRINE 40 MCG/ML (10ML) SYRINGE FOR IV PUSH (FOR BLOOD PRESSURE SUPPORT)
PREFILLED_SYRINGE | INTRAVENOUS | Status: DC | PRN
  Administered 2017-05-10: 40 ug via INTRAVENOUS
  Administered 2017-05-10 (×2): 80 ug via INTRAVENOUS
  Administered 2017-05-10: 40 ug via INTRAVENOUS
  Administered 2017-05-10 (×7): 80 ug via INTRAVENOUS

## 2017-05-10 MED ORDER — ASPIRIN 81 MG PO CHEW: 81.0000 mg | CHEWABLE_TABLET | Freq: Two times a day (BID) | ORAL | 0 refills | Status: AC

## 2017-05-10 MED ORDER — VANCOMYCIN HCL IN DEXTROSE 1-5 GM/200ML-% IV SOLN
1000.0000 mg | INTRAVENOUS | Status: AC
  Administered 2017-05-10: 1000 mg via INTRAVENOUS
  Filled 2017-05-10: qty 200

## 2017-05-10 MED ORDER — MIDAZOLAM HCL 5 MG/5ML IJ SOLN
INTRAMUSCULAR | Status: DC | PRN
  Administered 2017-05-10: 2 mg via INTRAVENOUS

## 2017-05-10 MED ORDER — ASPIRIN 81 MG PO CHEW
81.0000 mg | CHEWABLE_TABLET | Freq: Two times a day (BID) | ORAL | Status: DC
  Administered 2017-05-10 – 2017-05-11 (×2): 81 mg via ORAL
  Filled 2017-05-10 (×2): qty 1

## 2017-05-10 MED ORDER — ONDANSETRON HCL 4 MG/2ML IJ SOLN
INTRAMUSCULAR | Status: DC | PRN
  Administered 2017-05-10: 4 mg via INTRAVENOUS

## 2017-05-10 SURGICAL SUPPLY — 36 items
BAG DECANTER FOR FLEXI CONT (MISCELLANEOUS) IMPLANT
BAG ZIPLOCK 12X15 (MISCELLANEOUS) IMPLANT
BLADE SAG 18X100X1.27 (BLADE) ×3 IMPLANT
CAPT HIP TOTAL 2 ×3 IMPLANT
CLOTH BEACON ORANGE TIMEOUT ST (SAFETY) ×3 IMPLANT
COVER PERINEAL POST (MISCELLANEOUS) ×3 IMPLANT
COVER SURGICAL LIGHT HANDLE (MISCELLANEOUS) ×3 IMPLANT
DERMABOND ADVANCED (GAUZE/BANDAGES/DRESSINGS) ×2
DERMABOND ADVANCED .7 DNX12 (GAUZE/BANDAGES/DRESSINGS) ×1 IMPLANT
DRAPE STERI IOBAN 125X83 (DRAPES) ×3 IMPLANT
DRAPE U-SHAPE 47X51 STRL (DRAPES) ×6 IMPLANT
DRESSING AQUACEL AG SP 3.5X10 (GAUZE/BANDAGES/DRESSINGS) ×1 IMPLANT
DRSG AQUACEL AG SP 3.5X10 (GAUZE/BANDAGES/DRESSINGS) ×3
DURAPREP 26ML APPLICATOR (WOUND CARE) ×3 IMPLANT
ELECT REM PT RETURN 15FT ADLT (MISCELLANEOUS) ×3 IMPLANT
GLOVE BIOGEL PI IND STRL 6.5 (GLOVE) ×4 IMPLANT
GLOVE BIOGEL PI IND STRL 7.5 (GLOVE) ×6 IMPLANT
GLOVE BIOGEL PI INDICATOR 6.5 (GLOVE) ×8
GLOVE BIOGEL PI INDICATOR 7.5 (GLOVE) ×12
GOWN STRL REUS W/TWL LRG LVL3 (GOWN DISPOSABLE) ×12 IMPLANT
GOWN STRL REUS W/TWL XL LVL3 (GOWN DISPOSABLE) ×3 IMPLANT
HOLDER FOLEY CATH W/STRAP (MISCELLANEOUS) ×3 IMPLANT
PACK ANTERIOR HIP CUSTOM (KITS) ×3 IMPLANT
SUT MNCRL AB 4-0 PS2 18 (SUTURE) ×3 IMPLANT
SUT STRATAFIX 0 PDS 27 VIOLET (SUTURE)
SUT STRATAFIX 1PDS 45CM VIOLET (SUTURE) ×3 IMPLANT
SUT STRATAFIX SPIRAL PDS+ 70CM (SUTURE) ×3
SUT VIC AB 1 CT1 36 (SUTURE) ×9 IMPLANT
SUT VIC AB 2-0 CT1 27 (SUTURE) ×4
SUT VIC AB 2-0 CT1 TAPERPNT 27 (SUTURE) ×2 IMPLANT
SUTURE STRATFX 0 PDS 27 VIOLET (SUTURE) IMPLANT
SUTURE STRATFX SPIRL PDS+ 70CM (SUTURE) ×1 IMPLANT
TRAY FOLEY BAG SILVER LF 16FR (CATHETERS) ×3 IMPLANT
TRAY FOLEY W/METER SILVER 16FR (SET/KITS/TRAYS/PACK) IMPLANT
WATER STERILE IRR 1000ML POUR (IV SOLUTION) ×3 IMPLANT
YANKAUER SUCT BULB TIP 10FT TU (MISCELLANEOUS) IMPLANT

## 2017-05-10 NOTE — Plan of Care (Signed)
Plan of care discussed with patient 

## 2017-05-10 NOTE — Anesthesia Postprocedure Evaluation (Signed)
Anesthesia Post Note  Patient: Mary [name]  Procedure(s) Performed: RIGHT TOTAL HIP ARTHROPLASTY ANTERIOR APPROACH (Right Hip)     Patient location during evaluation: PACU Anesthesia Type: Spinal Level of consciousness: oriented and awake and alert Pain management: pain level controlled Vital Signs Assessment: post-procedure vital signs reviewed and stable Respiratory status: spontaneous breathing, respiratory function stable and patient connected to nasal cannula oxygen Cardiovascular status: blood pressure returned to baseline and stable Postop Assessment: no headache, no backache and no apparent nausea or vomiting Anesthetic complications: no    Last Vitals:  Vitals:   05/10/17 1145 05/10/17 1200  BP: 131/86 131/86  Pulse: 78 78  Resp: 15 16  Temp: (!) 36.4 C (!) 36.4 C  SpO2: 98% 98%    Last Pain:  Vitals:   05/10/17 1200  TempSrc: Oral  PainSc: 3                  Trevor IhaStephen A Babetta Paterson

## 2017-05-10 NOTE — Anesthesia Procedure Notes (Signed)
Spinal  Patient location during procedure: OR Staffing Resident/CRNA: Virgilene Stryker D, CRNA Performed: anesthesiologist and resident/CRNA  Preanesthetic Checklist Completed: patient identified, site marked, surgical consent, pre-op evaluation, timeout performed, IV checked, risks and benefits discussed and monitors and equipment checked Spinal Block Patient position: sitting Prep: Betadine Patient monitoring: heart rate, continuous pulse ox and blood pressure Approach: midline Location: L2-3 Injection technique: single-shot Needle Needle type: Sprotte  Needle gauge: 24 G Needle length: 9 cm Assessment Sensory level: T6 Additional Notes Expiration date of kit checked and confirmed. Patient tolerated procedure well, without complications.

## 2017-05-10 NOTE — Interval H&P Note (Signed)
History and Physical Interval Note:  05/10/2017 7:07 AM  Mary [name]  has presented today for surgery, with the diagnosis of Right hip osteoarthritis  The various methods of treatment have been discussed with the patient and family. After consideration of risks, benefits and other options for treatment, the patient has consented to  Procedure(s) with comments: RIGHT TOTAL HIP ARTHROPLASTY ANTERIOR APPROACH (Right) - 70 mins as a surgical intervention .  The patient's history has been reviewed, patient examined, no change in status, stable for surgery.  I have reviewed the patient's chart and labs.  Questions were answered to the patient's satisfaction.     Shelda PalMatthew D Tito Ausmus

## 2017-05-10 NOTE — Discharge Instructions (Signed)

## 2017-05-10 NOTE — Op Note (Signed)
NAME:  Mary [name]                ACCOUNT NO.: [id number]      MEDICAL RECORD NO.: [id number]      FACILITY[facility]      PHYSICIAN:  Shelda PalMatthew D Johnie Makki  DATE OF BIRTH:  04/12/1961     DATE OF PROCEDURE:  05/10/2017                                 OPERATIVE REPORT         PREOPERATIVE DIAGNOSIS: Right  hip osteoarthritis secondary to hip dysplasia     POSTOPERATIVE DIAGNOSIS:  Right hip osteoarthritis secondary to hip dysplasia     PROCEDURE:  Right total hip replacement through an anterior approach   utilizing DePuy THR system, component size 52mm pinnacle cup, a size 36+4 neutral   Altrex liner, a size 7 Hi Tri Lock stem with a 36+5 delta ceramic   ball.      SURGEON:  Madlyn FrankelMatthew D. Charlann Boxerlin, M.D.      ASSISTANT:  Skip MayerBlair Roberts, PA-C     ANESTHESIA:  Spinal.      SPECIMENS:  None.      COMPLICATIONS:  None.      BLOOD LOSS:  200 cc     DRAINS:  None.      INDICATION OF THE PROCEDURE:  Mary [name] is a 57 y.o. female who had   presented to office for evaluation of right hip pain.  Radiographs revealed   progressive degenerative changes with bone-on-bone   articulation to the  hip joint.  The patient had painful limited range of   motion significantly affecting their overall quality of life.  The patient was failing to    respond to conservative measures, and at this point was ready   to proceed with more definitive measures.  The patient has noted progressive   degenerative changes in his hip, progressive problems and dysfunction   with regarding the hip prior to surgery.  Consent was obtained for   benefit of pain relief.  Specific risk of infection, DVT, component   failure, dislocation, need for revision surgery, as well discussion of   the anterior versus posterior approach were reviewed.  Consent was   obtained for benefit of anterior pain relief through an anterior   approach.      PROCEDURE IN DETAIL:  The patient was brought to  operative theater.   Once adequate anesthesia, preoperative antibiotics, 1 gm of Vancomycin, weight dosed Gentamycin, and 10 mg of Decadron administered.   The patient was positioned supine on the OSI Hanna table.  Once adequate   padding of boney process was carried out, we had predraped out the hip, and  used fluoroscopy to confirm orientation of the pelvis and position.      The right hip was then prepped and draped from proximal iliac crest to   mid thigh with shower curtain technique.      Time-out was performed identifying the patient, planned procedure, and   extremity.     An incision was then made 2 cm distal and lateral to the   anterior superior iliac spine extending over the orientation of the   tensor fascia lata muscle and sharp dissection was carried down to the   fascia of the muscle and protractor placed in the soft tissues.  The fascia was then incised.  The muscle belly was identified and swept   laterally and retractor placed along the superior neck.  Following   cauterization of the circumflex vessels and removing some pericapsular   fat, a second cobra retractor was placed on the inferior neck.  A third   retractor was placed on the anterior acetabulum after elevating the   anterior rectus.  A L-capsulotomy was along the line of the   superior neck to the trochanteric fossa, then extended proximally and   distally.  Tag sutures were placed and the retractors were then placed   intracapsular.  We then identified the trochanteric fossa and   orientation of my neck cut, confirmed this radiographically   and then made a neck osteotomy with the femur on traction.  The femoral   head was removed without difficulty or complication.  Traction was let   off and retractors were placed posterior and anterior around the   acetabulum.      The labrum and foveal tissue were debrided.  I began reaming with a 46mm   reamer and reamed up to 51mm reamer with good bony bed  preparation and a 52   cup was chosen.  The final 52mm Pinnacle cup was then impacted under fluoroscopy  to confirm the depth of penetration and orientation with respect to   abduction.  A screw was placed followed by the hole eliminator.  The final   36+4 neutral Altrex liner was impacted with good visualized rim fit.  The cup was positioned anatomically within the acetabular portion of the pelvis.      At this point, the femur was rolled at 80 degrees.  Further capsule was   released off the inferior aspect of the femoral neck.  I then   released the superior capsule proximally.  The hook was placed laterally   along the femur and elevated manually and held in position with the bed   hook.  The leg was then extended and adducted with the leg rolled to 100   degrees of external rotation.  Once the proximal femur was fully   exposed, I used a box osteotome to set orientation.  I then began   broaching with the starting chili pepper broach and passed this by hand and then broached up to 7.  With the 7 broach in place I chose a high offset neck and did several trial reductions top best restore leg length and offset.  The offset was appropriate, leg lengths   appeared to be equal best matched with the +5 head ball confirmed radiographically.   Given these findings, I went ahead and dislocated the hip, repositioned all   retractors and positioned the right hip in the extended and abducted position.  The final 7 Hi Tri Lock stem was   chosen and it was impacted down to the level of neck cut.  Based on this   and the trial reduction, a 36+5 delta ceramic ball was chosen and   impacted onto a clean and dry trunnion, and the hip was reduced.  The   hip had been irrigated throughout the case again at this point.  I did   reapproximate the superior capsular leaflet to the anterior leaflet   using #1 Vicryl.  The fascia of the   tensor fascia lata muscle was then reapproximated using #1 Vicryl and #0  Stratafix sutures.  The   remaining wound was closed with 2-0 Vicryl and running  4-0 Monocryl.   The hip was cleaned, dried, and dressed sterilely using Dermabond and   Aquacel dressing.  She was then brought   to recovery room in stable condition tolerating the procedure well.    Skip Mayer, PA-C was present for the entirety of the case involved from   preoperative positioning, perioperative retractor management, general   facilitation of the case, as well as primary wound closure as assistant.            Madlyn Frankel Charlann Boxer, M.D.        05/10/2017 9:59 AM

## 2017-05-10 NOTE — Transfer of Care (Signed)
Immediate Anesthesia Transfer of Care Note  Patient: Mary [name]  Procedure(s) Performed: RIGHT TOTAL HIP ARTHROPLASTY ANTERIOR APPROACH (Right Hip)  Patient Location: PACU  Anesthesia Type:Spinal  Level of Consciousness: awake, alert  and oriented  Airway & Oxygen Therapy: Patient Spontanous Breathing and Patient connected to face mask oxygen  Post-op Assessment: Report given to RN and Post -op Vital signs reviewed and stable  Post vital signs: Reviewed and stable  Last Vitals:  Vitals:   05/10/17 0639  BP: (!) 159/96  Pulse: (!) 104  Resp: 16  Temp: 36.6 C  SpO2: 100%    Last Pain:  Vitals:   05/10/17 0639  TempSrc: Oral         Complications: No apparent anesthesia complications

## 2017-05-10 NOTE — Evaluation (Signed)
Physical Therapy Evaluation Patient Details Name: Mary Mary [name] MRN: [id number] DOB: 08/06/1960 Today's Date: 05/10/2017   History of Present Illness  57 yo female s/p R THA 05/10/17. Hx of ADD  Clinical Impression  POD 0-very limited eval. Pt having issues with nausea/vomiting. She was agreeable to attempting mobility. HOB was raised slightly. As pt was initiating movement, she became nauseous and vomited. Deferred any further attempt at mobility on today. Will return in a.m to progress activity as pt is able tolerate.     Follow Up Recommendations DC plan and follow up therapy as arranged by surgeon    Equipment Recommendations  None recommended by PT    Recommendations for Other Services OT consult     Precautions / Restrictions Precautions Precautions: Fall Restrictions Weight Bearing Restrictions: No RLE Weight Bearing: Weight bearing as tolerated      Mobility  Bed Mobility               General bed mobility comments: Attempted bed mobility. Raised HOB slightly. As pt began to move, she became nauseous and vomited. Deferred any further activity  Transfers                    Ambulation/Gait                Stairs            Wheelchair Mobility    Modified Rankin (Stroke Patients Only)       Balance                                             Pertinent Vitals/Pain Pain Assessment: Faces Faces Pain Scale: Hurts even more Pain Location: R hip Pain Descriptors / Indicators: Sore;Aching Pain Intervention(s): Limited activity within patient's tolerance    Home Living Family/patient expects to be discharged to:: Private residence Living Arrangements: Spouse/significant other Available Help at Discharge: Family Type of Home: House Home Access: Stairs to enter Entrance Stairs-Rails: None Entrance Stairs-Number of Steps: 3+1 Home Layout: Multi-level Home Equipment: Environmental consultantWalker - 2 wheels;Cane - single point       Prior Function Level of Independence: Independent               Hand Dominance        Extremity/Trunk Assessment        Lower Extremity Assessment Lower Extremity Assessment: Generalized weakness(s/p R THA)       Communication   Communication: No difficulties  Cognition Arousal/Alertness: Awake/Mary [name] Behavior During Therapy: WFL for tasks assessed/performed Overall Cognitive Status: Within Functional Limits for tasks assessed                                        General Comments      Exercises     Assessment/Plan    PT Assessment Patient needs continued PT services  PT Problem List Decreased strength;Decreased balance;Decreased mobility;Decreased range of motion;Decreased activity tolerance;Pain       PT Treatment Interventions DME instruction;Gait training;Functional mobility training;Therapeutic activities;Balance training;Patient/family education;Therapeutic exercise    PT Goals (Current goals can be found in the Care Plan section)  Acute Rehab PT Goals Patient Stated Goal: Resolution of N/V PT Goal Formulation: With patient Time For Goal Achievement: 05/24/17 Potential to Achieve Goals: Good  Frequency 7X/week   Barriers to discharge        Co-evaluation               AM-PAC PT "6 Clicks" Daily Activity  Outcome Measure Difficulty turning over in bed (including adjusting bedclothes, sheets and blankets)?: A Lot Difficulty moving from lying on back to sitting on the side of the bed? : A Lot Difficulty sitting down on and standing up from a chair with arms (e.g., wheelchair, bedside commode, etc,.)?: Unable Help needed moving to and from a bed to chair (including a wheelchair)?: A Little Help needed walking in hospital room?: A Little Help needed climbing 3-5 steps with a railing? : A Lot 6 Click Score: 13    End of Session   Activity Tolerance: Other (comment)(Limited by nausea/vomiting) Patient left: in bed;with  call bell/phone within reach;with family/visitor present   PT Visit Diagnosis: Muscle weakness (generalized) (M62.81);Difficulty in walking, not elsewhere classified (R26.2)    Time: 5284-1324 PT Time Calculation (min) (ACUTE ONLY): 14 min   Charges:   PT Evaluation $PT Eval Low Complexity: 1 Low     PT G Codes:         Mary Mary [name], MPT Pager: [phone]

## 2017-05-11 LAB — BASIC METABOLIC PANEL
Anion gap: 7 (ref 5–15)
BUN: 15 mg/dL (ref 6–20)
CALCIUM: 9 mg/dL (ref 8.9–10.3)
CHLORIDE: 105 mmol/L (ref 101–111)
CO2: 27 mmol/L (ref 22–32)
CREATININE: 0.77 mg/dL (ref 0.44–1.00)
GFR calc non Af Amer: >60 mL/min (ref >60)
Glucose, Bld: 106 mg/dL — ABNORMAL HIGH (ref 65–99)
Potassium: 3.8 mmol/L (ref 3.5–5.1)
Sodium: 139 mmol/L (ref 135–145)

## 2017-05-11 LAB — CBC
HCT: 34.2 % — ABNORMAL LOW (ref 36.0–46.0)
HEMOGLOBIN: 11.6 g/dL — AB (ref 12.0–15.0)
MCH: 32 pg (ref 26.0–34.0)
MCHC: 33.9 g/dL (ref 30.0–36.0)
MCV: 94.5 fL (ref 78.0–100.0)
Platelets: 216 10*3/uL (ref 150–400)
RBC: 3.62 MIL/uL — ABNORMAL LOW (ref 3.87–5.11)
RDW: 13 % (ref 11.5–15.5)
WBC: 12.1 10*3/uL — ABNORMAL HIGH (ref 4.0–10.5)

## 2017-05-11 MED ORDER — ONDANSETRON HCL 4 MG PO TABS: 4.0000 mg | ORAL_TABLET | Freq: Three times a day (TID) | ORAL | 0 refills | Status: DC | PRN

## 2017-05-11 MED ORDER — TRAMADOL HCL 50 MG PO TABS: 50.0000 mg | ORAL_TABLET | Freq: Four times a day (QID) | ORAL | 0 refills | Status: DC | PRN

## 2017-05-11 MED ORDER — ACETAMINOPHEN 500 MG PO TABS: 1000.0000 mg | ORAL_TABLET | Freq: Three times a day (TID) | ORAL | 0 refills | Status: AC

## 2017-05-11 NOTE — Progress Notes (Signed)
    Durable Medical Equipment  (From admission, onward)        Start     Ordered   05/11/17 1136  For home use only DME Bedside commode  Once    Question:  Patient needs a bedside commode to treat with the following condition  Answer:  Surgery, elective   05/11/17 1136    [phone]

## 2017-05-11 NOTE — Plan of Care (Signed)
Plan of care reviewed and discussed with patient and husband.

## 2017-05-11 NOTE — Evaluation (Signed)
Occupational Therapy Evaluation Patient Details Name: Mary Rich MRN: [id number] DOB: 02/12/1961 Today's Date: 05/11/2017    History of Present Illness 57 yo female s/p R THA 05/10/17. Hx of ADD   Clinical Impression   OT education complete.  Ae ed provided as well as strategies to A with ADL activity s/p THA. Pt not able to quite reach feet this OT visit.    Follow Up Recommendations  No OT follow up    Equipment Recommendations  3 in 1 bedside commode    Recommendations for Other Services       Precautions / Restrictions Precautions Precautions: Fall Restrictions Weight Bearing Restrictions: No RLE Weight Bearing: Weight bearing as tolerated      Mobility Bed Mobility Overal bed mobility: Needs Assistance Bed Mobility: Supine to Sit     Supine to sit: Supervision     General bed mobility comments: Assist for R LE. Increased time.   Transfers Overall transfer level: Needs assistance Equipment used: Rolling walker (2 wheeled) Transfers: Sit to/from UGI CorporationStand;Stand Pivot Transfers Sit to Stand: Supervision Stand pivot transfers: Supervision       General transfer comment: close guard for safety. VCs hand placement. Increased time.         ADL either performed or assessed with clinical judgement   ADL Overall ADL's : Needs assistance/impaired                                       General ADL Comments: Pt overall S- min A with ADL activity. Pt educated on AE to A with LB dressing. Pt will obtain reacher and sock aid. Pt S with shower transfer as well as toilet. Pt would like a 3n1     Vision Patient Visual Report: No change from baseline              Pertinent Vitals/Pain Pain Assessment: 0-10 Pain Score: 3  Pain Location: R hip Pain Descriptors / Indicators: Sore Pain Intervention(s): Limited activity within patient's tolerance;Monitored during session     Hand Dominance     Extremity/Trunk Assessment Upper Extremity  Assessment Upper Extremity Assessment: Overall WFL for tasks assessed           Communication Communication Communication: No difficulties   Cognition Arousal/Alertness: Awake/alert Behavior During Therapy: WFL for tasks assessed/performed Overall Cognitive Status: Within Functional Limits for tasks assessed                                                Home Living Family/patient expects to be discharged to:: Private residence Living Arrangements: Spouse/significant other Available Help at Discharge: Family Type of Home: House Home Access: Stairs to enter Secretary/administratorntrance Stairs-Number of Steps: 3+1 Entrance Stairs-Rails: None Home Layout: Multi-level Alternate Level Stairs-Number of Steps: 7-platform-8 (total of 15)   Bathroom Shower/Tub: Walk-in shower         Home Equipment: Environmental consultantWalker - 2 wheels;Cane - single point          Prior Functioning/Environment Level of Independence: Independent                          OT Goals(Current goals can be found in the care plan section) Acute Rehab OT Goals Patient Stated Goal: home today OT  Goal Formulation: With patient  OT Frequency:     Barriers to D/C:                  End of Session Nurse Communication: Mobility status  Activity Tolerance: Patient tolerated treatment well Patient left: in chair                   Time: 5956-3875 OT Time Calculation (min): 22 min Charges:  OT General Charges $OT Visit: 1 Visit OT Evaluation $OT Eval Low Complexity: 1 Low G-Codes:     Lise Auer, OT [phone]  Einar Crow D 05/11/2017, 12:01 PM

## 2017-05-11 NOTE — Progress Notes (Signed)
     Subjective: 1 Day Post-Op Procedure(s) (LRB): RIGHT TOTAL HIP ARTHROPLASTY ANTERIOR APPROACH (Right)   Patient reports pain as mild, pain controlled. Significant N&V yesterday, doing much better after changing to tramadol.  Otherwise no events throughout the night. Looking forward to recovering.  Ready to be discharged home.   Objective:   VITALS:   Vitals:   05/11/17 0155 05/11/17 0608  BP: 112/60 (!) 143/86  Pulse: 82 88  Resp: 16 16  Temp: 98.9 F (37.2 C) 98.5 F (36.9 C)  SpO2: 99% 98%    Dorsiflexion/Plantar flexion intact Incision: dressing C/D/I No cellulitis present Compartment soft  LABS Recent Labs    05/11/17 0545  HGB 11.6*  HCT 34.2*  WBC 12.1*  PLT 216    Recent Labs    05/11/17 0545  NA 139  K 3.8  BUN 15  CREATININE 0.77  GLUCOSE 106*     Assessment/Plan: 1 Day Post-Op Procedure(s) (LRB): RIGHT TOTAL HIP ARTHROPLASTY ANTERIOR APPROACH (Right) Foley cath d/c'ed Advance diet Up with therapy D/C IV fluids Discharge home [facility] Follow up with OLIN,Kiriana Worthington D in 2 weeks.  Contact information:  [facility] [address], [address] [place] [id number] [phone]        Anastasio AuerbachMatthew S. Alaisha Eversley   PAC  05/11/2017, 8:38 AM

## 2017-05-11 NOTE — Progress Notes (Signed)
Physical Therapy Treatment Patient Details Name: Mary Rich MRN: [id number] DOB: 12/17/1960 Today's Date: 05/11/2017    History of Present Illness 57 yo female s/p R THA 05/10/17. Hx of ADD    PT Comments    Pt was feeling much better today. Reviewed/practiced exercises, gait training, and stair training. Pt politely declined 2nd session-she felt comfortable with everything we covered on today. Issued HEP for pt to perform 2x/day until surgeon clears her for more activity. All education completed. Okay to d/c from PT standpoint-made RN aware.     Follow Up Recommendations  DC plan and follow up therapy as arranged by surgeon     Equipment Recommendations  None recommended by PT    Recommendations for Other Services OT consult     Precautions / Restrictions Precautions Precautions: Fall Restrictions Weight Bearing Restrictions: No RLE Weight Bearing: Weight bearing as tolerated    Mobility  Bed Mobility Overal bed mobility: Needs Assistance Bed Mobility: Supine to Sit     Supine to sit: Min assist     General bed mobility comments: Assist for R LE. Increased time.   Transfers Overall transfer level: Needs assistance Equipment used: Rolling walker (2 wheeled) Transfers: Sit to/from Stand Sit to Stand: Min guard         General transfer comment: close guard for safety. VCs hand placement. Increased time.   Ambulation/Gait Ambulation/Gait assistance: Min guard Ambulation Distance (Feet): 100 Feet Assistive device: Rolling walker (2 wheeled) Gait Pattern/deviations: Step-to pattern;Step-through pattern;Decreased stride length     General Gait Details: close guard for safety. VCs safety, sequence. Slow gait speed.    Stairs Stairs: Yes  Min Assist  Stair Management: One rail Right;Step to pattern Number of Stairs: 2(x2) General stair comments: up and over portable steps x 2. Once with 1 rail on R. Once with 1 HHA on R. VCS safety, sequence, technique.    Wheelchair Mobility    Modified Rankin (Stroke Patients Only)       Balance                                            Cognition Arousal/Alertness: Awake/alert Behavior During Therapy: WFL for tasks assessed/performed Overall Cognitive Status: Within Functional Limits for tasks assessed                                        Exercises Total Joint Exercises Ankle Circles/Pumps: AROM;Both;10 reps;Seated Quad Sets: AROM;Both;10 reps;Seated Heel Slides: AAROM;Right;10 reps;Supine Hip ABduction/ADduction: AROM;Right;10 reps;Standing;Supine Knee Flexion: AROM;Right;10 reps;Standing Marching in Standing: AROM;Both;10 reps;Standing    General Comments        Pertinent Vitals/Pain Pain Assessment: 0-10 Pain Score: 5  Pain Location: R hip/thigh Pain Descriptors / Indicators: Sore;Aching Pain Intervention(s): Monitored during session;Repositioned;Ice applied    Home Living                      Prior Function            PT Goals (current goals can now be found in the care plan section) Progress towards PT goals: Progressing toward goals    Frequency    7X/week      PT Plan Current plan remains appropriate    Co-evaluation  AM-PAC PT "6 Clicks" Daily Activity  Outcome Measure  Difficulty turning over in bed (including adjusting bedclothes, sheets and blankets)?: A Lot Difficulty moving from lying on back to sitting on the side of the bed? : Unable Difficulty sitting down on and standing up from a chair with arms (e.g., wheelchair, bedside commode, etc,.)?: Unable Help needed moving to and from a bed to chair (including a wheelchair)?: A Little Help needed walking in hospital room?: A Little Help needed climbing 3-5 steps with a railing? : A Little 6 Click Score: 13    End of Session Equipment Utilized During Treatment: Gait belt Activity Tolerance: Patient tolerated treatment well Patient  left: in chair;with call bell/phone within reach;with family/visitor present   PT Visit Diagnosis: Muscle weakness (generalized) (M62.81);Difficulty in walking, not elsewhere classified (R26.2)     Time: 1000-1025 PT Time Calculation (min) (ACUTE ONLY): 25 min  Charges:  $Gait Training: 8-22 mins $Therapeutic Exercise: 8-22 mins                    G Codes:         Rebeca Alert, MPT Pager: [phone]

## 2017-05-17 NOTE — Discharge Summary (Signed)
Physician Discharge Summary  Patient ID: Mary Rich MRN: [id number] DOB/AGE: 01/13/1961 57 y.o.  Admit date: 05/10/2017 Discharge date: 05/11/2017   Procedures:  Procedure(s) (LRB): RIGHT TOTAL HIP ARTHROPLASTY ANTERIOR APPROACH (Right)  Attending Physician:  Dr. Durene Romans   Admission Diagnoses:   Right hip primary OA / pain  Discharge Diagnoses:  Principal Problem:   S/P right THA, AA Active Problems:   S/P hip replacement  Past Medical History:  Diagnosis Date  . Attention deficit disorder   . Chest pain    "IVE HAD SOME PLEURISY AT RANDOM OVER THE LAST 10 YEARS, USUALLY WHENI HAVE A BAD UPPER RESPIRATORY INFECTION"   . Hypertension   . PONV (postoperative nausea and vomiting)    LAPARASCOPY YEARS AGO CAUSED NAUSEA     HPI:    Mary Rich, 57 y.o. female, has a history of pain and functional disability in the right hip(s) due to arthritis and patient has failed non-surgical conservative treatments for greater than 12 weeks to include NSAID's and/or analgesics, corticosteriod injections and activity modification.  Onset of symptoms was gradual starting ~5 years ago with gradually worsening course since that time.The patient noted no past surgery on the right hip(s).  Patient currently rates pain in the right hip at 9 out of 10 with activity. Patient has night pain, worsening of pain with activity and weight bearing, trendelenberg gait, pain that interfers with activities of daily living and pain with passive range of motion. Patient has evidence of periarticular osteophytes and joint space narrowing by imaging studies. This condition presents safety issues increasing the risk of falls. There is no current active infection.   Risks, benefits and expectations were discussed with the patient.  Risks including but not limited to the risk of anesthesia, blood clots, nerve damage, blood vessel damage, failure of the prosthesis, infection and up to and including death.   Patient understand the risks, benefits and expectations and wishes to proceed with surgery.   PCP: Laurann Montana, MD   Discharged Condition: good  Hospital Course:  Patient underwent the above stated procedure on 05/10/2017. Patient tolerated the procedure well and brought to the recovery room in good condition and subsequently to the floor.  POD #1 BP: 143/86 ; Pulse: 88 ; Temp: 98.5 F (36.9 C) ; Resp: 16 Patient reports pain as mild, pain controlled. Significant N&V yesterday, doing much better after changing to tramadol.  Otherwise no events throughout the night. Looking forward to recovering.  Ready to be discharged home.  Dorsiflexion/plantar flexion intact, incision: dressing C/D/I, no cellulitis present and compartment soft.   LABS  Basename    HGB     11.6  HCT     34.2    Discharge Exam: General appearance: alert, cooperative and no distress Extremities: Homans sign is negative, no sign of DVT, no edema, redness or tenderness in the calves or thighs and no ulcers, gangrene or trophic changes  Disposition: Home with follow up in 2 weeks   Follow-up Information    Durene Romans, MD. Schedule an appointment as soon as possible for a visit in 2 week(s).   Specialty:  Orthopedic Surgery Contact information: [address] [place] [address] [phone]           Discharge Instructions    Call MD / Call 911   Complete by:  As directed    If you experience chest pain or shortness of breath, CALL 911 and be transported to the hospital  emergency room.  If you develope a fever above 101 F, pus (white drainage) or increased drainage or redness at the wound, or calf pain, call your surgeon's office.   Change dressing   Complete by:  As directed    Maintain surgical dressing until follow up in the clinic. If the edges start to pull up, may reinforce with tape. If the dressing is no longer working, may remove and cover with gauze and tape, but must keep  the area dry and clean.  Call with any questions or concerns.   Constipation Prevention   Complete by:  As directed    Drink plenty of fluids.  Prune juice may be helpful.  You may use a stool softener, such as Colace (over the counter) 100 mg twice a day.  Use MiraLax (over the counter) for constipation as needed.   Diet - low sodium heart healthy   Complete by:  As directed    Discharge instructions   Complete by:  As directed    Maintain surgical dressing until follow up in the clinic. If the edges start to pull up, may reinforce with tape. If the dressing is no longer working, may remove and cover with gauze and tape, but must keep the area dry and clean.  Follow up in 2 weeks at [facility]. Call with any questions or concerns.   Increase activity slowly as tolerated   Complete by:  As directed    Weight bearing as tolerated with assist device (walker, cane, etc) as directed, use it as long as suggested by your surgeon or therapist, typically at least 4-6 weeks.   TED hose   Complete by:  As directed    Use stockings (TED hose) for 2 weeks on both leg(s).  You may remove them at night for sleeping.      Allergies as of 05/11/2017      Reactions   Latex Itching   Penicillins Hives   Has patient had a PCN reaction causing immediate rash, facial/tongue/throat swelling, SOB or lightheadedness with hypotension: No Has patient had a PCN reaction causing severe rash involving mucus membranes or skin necrosis: Yes Has patient had a PCN reaction that required hospitalization: No Has patient had a PCN reaction occurring within the last 10 years: No If all of the above answers are "NO", then may proceed with Cephalosporin use.      Medication List    STOP taking these medications   aspirin EC 81 MG tablet Replaced by:  aspirin 81 MG chewable tablet   naproxen sodium 220 MG tablet Commonly known as:  ALEVE     TAKE these medications   acetaminophen 500 MG tablet Commonly  known as:  TYLENOL Take 2 tablets (1,000 mg total) by mouth every 8 (eight) hours.   AIRBORNE PO Take 1 tablet by mouth daily as needed (immune support).   amphetamine-dextroamphetamine 30 MG 24 hr capsule Commonly known as:  ADDERALL XR Take 30 mg by mouth daily.   amphetamine-dextroamphetamine 10 MG tablet Commonly known as:  ADDERALL Take 10 to 20 mgs in the afternoon as needed for concentration   aspirin 81 MG chewable tablet Commonly known as:  ASPIRIN CHILDRENS Chew 1 tablet (81 mg total) by mouth 2 (two) times daily. Take for 4 weeks, then resume regular dose. Replaces:  aspirin EC 81 MG tablet   CALCIUM + D PO Take 1 tablet by mouth daily.   cholecalciferol 1000 units tablet Commonly known as:  VITAMIN D  Take 1,000 Units by mouth daily.   docusate sodium 100 MG capsule Commonly known as:  COLACE Take 1 capsule (100 mg total) by mouth 2 (two) times daily.   ferrous sulfate 325 (65 FE) MG tablet Commonly known as:  FERROUSUL Take 1 tablet (325 mg total) by mouth 3 (three) times daily with meals.   lisinopril 5 MG tablet Commonly known as:  PRINIVIL,ZESTRIL Take 5 mg by mouth daily.   methocarbamol 500 MG tablet Commonly known as:  ROBAXIN Take 1 tablet (500 mg total) by mouth every 6 (six) hours as needed for muscle spasms.   multivitamin tablet Take 1 tablet by mouth daily.   ondansetron 4 MG tablet Commonly known as:  ZOFRAN Take 1 tablet (4 mg total) by mouth every 8 (eight) hours as needed for nausea or vomiting.   polyethylene glycol packet Commonly known as:  MIRALAX / GLYCOLAX Take 17 g by mouth 2 (two) times daily.   SALONPAS PAIN RELIEF PATCH EX Apply 1 patch topically daily as needed (pain).   traMADol 50 MG tablet Commonly known as:  ULTRAM Take 1-2 tablets (50-100 mg total) by mouth every 6 (six) hours as needed.   vitamin B-12 1000 MCG tablet Commonly known as:  CYANOCOBALAMIN Take 1,000 mcg by mouth daily.            Discharge  Care Instructions  (From admission, onward)        Start     Ordered   05/11/17 0000  Change dressing    Comments:  Maintain surgical dressing until follow up in the clinic. If the edges start to pull up, may reinforce with tape. If the dressing is no longer working, may remove and cover with gauze and tape, but must keep the area dry and clean.  Call with any questions or concerns.   05/11/17 [id number]       Signed: Anastasio AuerbachMatthew S. Honore Wipperfurth   PA-C  05/17/2017, 9:49 AM

## 2019-02-12 DIAGNOSIS — N183 Chronic kidney disease, stage 3 unspecified: Secondary | ICD-10-CM | POA: Diagnosis not present

## 2019-02-12 DIAGNOSIS — I129 Hypertensive chronic kidney disease with stage 1 through stage 4 chronic kidney disease, or unspecified chronic kidney disease: Secondary | ICD-10-CM | POA: Diagnosis not present

## 2019-02-12 DIAGNOSIS — F900 Attention-deficit hyperactivity disorder, predominantly inattentive type: Secondary | ICD-10-CM | POA: Diagnosis not present

## 2019-02-12 DIAGNOSIS — F4321 Adjustment disorder with depressed mood: Secondary | ICD-10-CM | POA: Diagnosis not present

## 2019-02-20 DIAGNOSIS — I129 Hypertensive chronic kidney disease with stage 1 through stage 4 chronic kidney disease, or unspecified chronic kidney disease: Secondary | ICD-10-CM | POA: Diagnosis not present

## 2019-02-20 DIAGNOSIS — Z23 Encounter for immunization: Secondary | ICD-10-CM | POA: Diagnosis not present

## 2019-04-06 DIAGNOSIS — Z01419 Encounter for gynecological examination (general) (routine) without abnormal findings: Secondary | ICD-10-CM | POA: Diagnosis not present

## 2019-04-06 DIAGNOSIS — Z1231 Encounter for screening mammogram for malignant neoplasm of breast: Secondary | ICD-10-CM | POA: Diagnosis not present

## 2019-04-06 DIAGNOSIS — Z6823 Body mass index (BMI) 23.0-23.9, adult: Secondary | ICD-10-CM | POA: Diagnosis not present

## 2019-05-17 IMAGING — DX DG PORTABLE PELVIS
1 series · 1 of 1 positions shown · non-contrast
Comparison: None.

CLINICAL DATA: Right anterior total hip replacement

EXAM:
PORTABLE PELVIS 1-2 VIEWS

[pelvis ap]
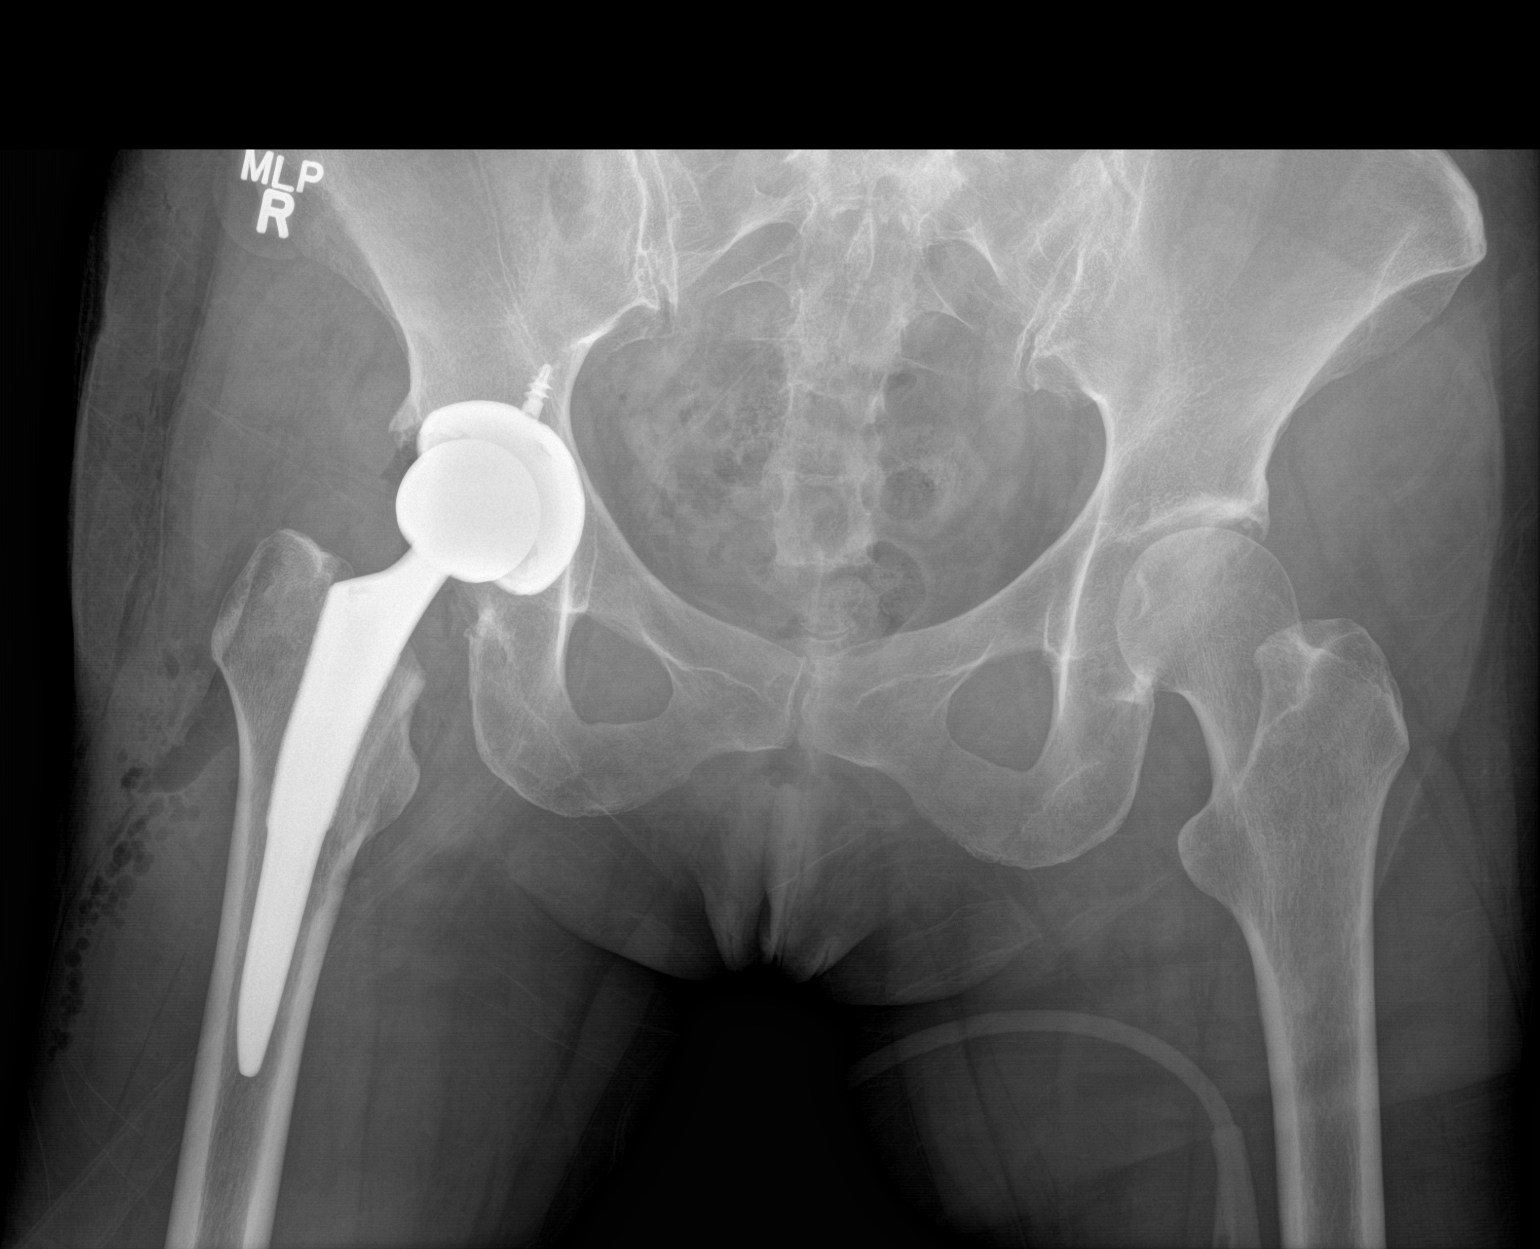

[1 of 1 positions shown; findings below may reference images not displayed]

FINDINGS: Interval right total hip arthroplasty without failure complication.
No fracture or dislocation. Postsurgical changes in the surrounding
soft tissues.
IMPRESSION: Interval right total hip arthroplasty.

## 2019-07-28 ENCOUNTER — Ambulatory Visit: Payer: PRIVATE HEALTH INSURANCE

## 2019-08-16 DIAGNOSIS — F900 Attention-deficit hyperactivity disorder, predominantly inattentive type: Secondary | ICD-10-CM | POA: Diagnosis not present

## 2019-08-16 DIAGNOSIS — I129 Hypertensive chronic kidney disease with stage 1 through stage 4 chronic kidney disease, or unspecified chronic kidney disease: Secondary | ICD-10-CM | POA: Diagnosis not present

## 2019-08-16 DIAGNOSIS — N183 Chronic kidney disease, stage 3 unspecified: Secondary | ICD-10-CM | POA: Diagnosis not present

## 2019-08-16 DIAGNOSIS — R079 Chest pain, unspecified: Secondary | ICD-10-CM | POA: Diagnosis not present

## 2019-10-25 NOTE — Progress Notes (Signed)
Cardiology Office Note   Date:  10/29/2019   ID:  Mary Rich, DOB Feb 21, 1961, MRN [id number]  PCP:  Laurann Montana, MD  Cardiologist:   Ardelia Wrede [place], MD   Chief Complaint  Patient presents with  . Hypertension  . Chest Pain      History of Present Illness: Mary Rich is a 59 y.o. female who is seen at the request of Dr Cliffton Asters for evaluation of chest pain. She has a history of HTN. She was seen remotely in April 2010 and had a normal Stress Echo at that time.   She reports that over the past 2 months she has experienced a heaviness in her chest. She has wondered if this is anxiety related. This has occurred 4-5 times and is always at rest. No clear relation to meals. She walks daily and has noted no symptoms with exertion. She has always had some periods where she gets hot and has pain in the middle of her chest. This appears to get better with TUMS. She has bee under stress this past year with passing of her mother. 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     Past Medical History:  Diagnosis Date  . Attention deficit disorder   . Chest pain    "IVE HAD SOME PLEURISY AT RANDOM OVER THE LAST 10 YEARS, USUALLY WHENI HAVE A BAD UPPER RESPIRATORY INFECTION"   . Hypertension   . PONV (postoperative nausea and vomiting)    LAPARASCOPY YEARS AGO CAUSED NAUSEA     Past Surgical History:  Procedure Laterality Date  . LAPAROSCOPY     ABDOMINAL FOR EVALOF POTENTIAL ECTOPIC PREGNANCY   . TOTAL HIP ARTHROPLASTY Right 05/10/2017   Procedure: RIGHT TOTAL HIP ARTHROPLASTY ANTERIOR APPROACH;  Surgeon: Durene Romans, MD;  Location: [facility];  Service: Orthopedics;  Laterality: Right;  70 mins  . WISDOM TOOTH EXTRACTION  1982   x4     Current Outpatient Medications  Medication Sig Dispense Refill  . acetaminophen (TYLENOL) 500 MG tablet Take 2 tablets (1,000 mg total) by mouth every 8 (eight) hours. 30 tablet 0  . amphetamine-dextroamphetamine (ADDERALL  XR) 30 MG 24 hr capsule Take 30 mg by mouth daily.    Marland Kitchen amphetamine-dextroamphetamine (ADDERALL) 10 MG tablet Take 10 to 20 mgs in the afternoon as needed for concentration    . Calcium Citrate-Vitamin D (CALCIUM + D PO) Take 1 tablet by mouth daily.    . cholecalciferol (VITAMIN D) 1000 units tablet Take 1,000 Units by mouth daily.    Marland Kitchen lisinopril (PRINIVIL,ZESTRIL) 5 MG tablet Take 5 mg by mouth daily.    . Multiple Vitamin (MULTIVITAMIN) tablet Take 1 tablet by mouth daily.    . vitamin B-12 (CYANOCOBALAMIN) 1000 MCG tablet Take 1,000 mcg by mouth daily.     No current facility-administered medications for this visit.    Allergies:   Latex and Penicillins    Social History:  The patient  reports that she has never smoked. She has never used smokeless tobacco. She reports that she does not drink alcohol and does not use drugs.   Family History:  The patient's family history includes Crohn's disease in her daughter; Dementia in her mother; Diabetes in her mother; Heart disease in her mother; Hypertension in her mother; Multiple sclerosis in her son; Pulmonary embolism in her father; Stroke in her maternal grandfather.    ROS:  Please see the history of present illness.  Otherwise, review of systems are positive for none.   All other systems are reviewed and negative.    PHYSICAL EXAM: VS:  BP 140/90   Pulse 90   Temp (!) 97.4 F (36.3 C)   Ht 5' 9.5" (1.765 m)   Wt 158 lb (71.7 kg)   LMP 06/16/2013   SpO2 96%   BMI 23.00 kg/m  , BMI Body mass index is 23 kg/m. GEN: Well nourished, well developed, in no acute distress  HEENT: normal  Neck: no JVD, carotid bruits, or masses Cardiac: RRR; no murmurs, rubs, or gallops,no edema  Respiratory:  clear to auscultation bilaterally, normal work of breathing GI: soft, nontender, nondistended, + BS MS: no deformity or atrophy  Skin: warm and dry, no rash Neuro:  Strength and sensation are intact Psych: euthymic mood, full  affect   EKG:  EKG is ordered today. The ekg ordered today demonstrates NSR rate 90, normal Ecg. I have personally reviewed and interpreted this study.    Recent Labs: No results found for requested labs within last 8760 hours.    Lipid Panel No results found for: CHOL, TRIG, HDL, CHOLHDL, VLDL, LDLCALC, LDLDIRECT   Labs dated 02/20/19: cholesterol 209, triglycerides 121, HDL 73, LDL 112. CBC, CMET, TSH normal.  Wt Readings from Last 3 Encounters:  10/29/19 158 lb (71.7 kg)  05/10/17 157 lb (71.2 kg)  05/05/17 157 lb (71.2 kg)      Other studies Reviewed: Additional studies/ records that were reviewed today include: none   ASSESSMENT AND PLAN:  1.  Atypical chest pain. Symptoms are not really concerning for ischemia. I suspect this is more related to acid reflux. She does have a history of HTN. Mother had CABG at an advanced age and was diabetic.  2. HTN. Controlled.  Plan: I have recommended a trial of anti-reflux therapy with OTC prilosec for one month. If symptoms improve then no further work up needed. If she continues to have chest pain or it progresses we could always reevaluate with stress testing.    Current medicines are reviewed at length with the patient today.  The patient does not have concerns regarding medicines.  The following changes have been made:  OTC prilosec daily.   Labs/ tests ordered today include:  No orders of the defined types were placed in this encounter.    Disposition:   FU with me PRN    Signed, Ranny Wiebelhaus [place], MD  10/29/2019 9:44 AM    [facility] [address], [place], [place], [address] Phone [phone], Fax [phone]

## 2019-10-29 ENCOUNTER — Encounter: Payer: Self-pay | Admitting: Cardiology

## 2019-10-29 ENCOUNTER — Other Ambulatory Visit: Payer: Self-pay

## 2019-10-29 ENCOUNTER — Ambulatory Visit: Payer: BC Managed Care – PPO | Admitting: Cardiology

## 2019-10-29 VITALS — BP 140/90 | HR 90 | Temp 97.4°F | Ht 69.5 in | Wt 158.0 lb

## 2019-10-29 DIAGNOSIS — R0789 Other chest pain: Secondary | ICD-10-CM

## 2019-10-29 DIAGNOSIS — I10 Essential (primary) hypertension: Secondary | ICD-10-CM

## 2019-10-29 NOTE — Patient Instructions (Signed)
Medication Instructions: Take Prilosec OTC daily for 1 month then as needed   Lab Work: None ordered   Testing/Procedures: None ordered   Follow-Up: At [facility], you and your health needs are our priority.  As part of our continuing mission to provide you with exceptional heart care, we have created designated Provider Care Teams.  These Care Teams include your primary Cardiologist (physician) and Advanced Practice Providers (APPs -  Physician Assistants and Nurse Practitioners) who all work together to provide you with the care you need, when you need it.  We recommend signing up for the patient portal called "MyChart".  Sign up information is provided on this After Visit Summary.  MyChart is used to connect with patients for Virtual Visits (Telemedicine).  Patients are able to view lab/test results, encounter notes, upcoming appointments, etc.  Non-urgent messages can be sent to your provider as well.   To learn more about what you can do with MyChart, go to [email or web address].    Your next appointment:  As Needed   The format for your next appointment: Office   Provider:  Dr.Jordan

## 2020-02-15 DIAGNOSIS — F900 Attention-deficit hyperactivity disorder, predominantly inattentive type: Secondary | ICD-10-CM | POA: Diagnosis not present

## 2020-02-15 DIAGNOSIS — I129 Hypertensive chronic kidney disease with stage 1 through stage 4 chronic kidney disease, or unspecified chronic kidney disease: Secondary | ICD-10-CM | POA: Diagnosis not present

## 2020-02-15 DIAGNOSIS — N183 Chronic kidney disease, stage 3 unspecified: Secondary | ICD-10-CM | POA: Diagnosis not present

## 2020-02-15 DIAGNOSIS — Z23 Encounter for immunization: Secondary | ICD-10-CM | POA: Diagnosis not present

## 2020-04-08 DIAGNOSIS — Z01419 Encounter for gynecological examination (general) (routine) without abnormal findings: Secondary | ICD-10-CM | POA: Diagnosis not present

## 2020-04-08 DIAGNOSIS — Z6823 Body mass index (BMI) 23.0-23.9, adult: Secondary | ICD-10-CM | POA: Diagnosis not present

## 2020-04-08 DIAGNOSIS — Z13820 Encounter for screening for osteoporosis: Secondary | ICD-10-CM | POA: Diagnosis not present

## 2020-04-08 DIAGNOSIS — Z1231 Encounter for screening mammogram for malignant neoplasm of breast: Secondary | ICD-10-CM | POA: Diagnosis not present

## 2020-04-24 DIAGNOSIS — M8580 Other specified disorders of bone density and structure, unspecified site: Secondary | ICD-10-CM | POA: Diagnosis not present

## 2020-08-15 DIAGNOSIS — F900 Attention-deficit hyperactivity disorder, predominantly inattentive type: Secondary | ICD-10-CM | POA: Diagnosis not present

## 2020-08-15 DIAGNOSIS — N183 Chronic kidney disease, stage 3 unspecified: Secondary | ICD-10-CM | POA: Diagnosis not present

## 2020-08-15 DIAGNOSIS — I129 Hypertensive chronic kidney disease with stage 1 through stage 4 chronic kidney disease, or unspecified chronic kidney disease: Secondary | ICD-10-CM | POA: Diagnosis not present

## 2020-08-27 DIAGNOSIS — L820 Inflamed seborrheic keratosis: Secondary | ICD-10-CM | POA: Diagnosis not present

## 2020-08-27 DIAGNOSIS — D485 Neoplasm of uncertain behavior of skin: Secondary | ICD-10-CM | POA: Diagnosis not present

## 2020-08-27 DIAGNOSIS — L821 Other seborrheic keratosis: Secondary | ICD-10-CM | POA: Diagnosis not present

## 2020-08-27 DIAGNOSIS — D225 Melanocytic nevi of trunk: Secondary | ICD-10-CM | POA: Diagnosis not present

## 2020-08-27 DIAGNOSIS — L308 Other specified dermatitis: Secondary | ICD-10-CM | POA: Diagnosis not present

## 2020-08-27 DIAGNOSIS — Z1283 Encounter for screening for malignant neoplasm of skin: Secondary | ICD-10-CM | POA: Diagnosis not present

## 2020-12-16 DIAGNOSIS — D2271 Melanocytic nevi of right lower limb, including hip: Secondary | ICD-10-CM | POA: Diagnosis not present

## 2020-12-16 DIAGNOSIS — D485 Neoplasm of uncertain behavior of skin: Secondary | ICD-10-CM | POA: Diagnosis not present

## 2020-12-16 DIAGNOSIS — L820 Inflamed seborrheic keratosis: Secondary | ICD-10-CM | POA: Diagnosis not present

## 2021-01-30 DIAGNOSIS — Z23 Encounter for immunization: Secondary | ICD-10-CM | POA: Diagnosis not present

## 2021-01-30 DIAGNOSIS — F900 Attention-deficit hyperactivity disorder, predominantly inattentive type: Secondary | ICD-10-CM | POA: Diagnosis not present

## 2021-01-30 DIAGNOSIS — N183 Chronic kidney disease, stage 3 unspecified: Secondary | ICD-10-CM | POA: Diagnosis not present

## 2021-01-30 DIAGNOSIS — Z1159 Encounter for screening for other viral diseases: Secondary | ICD-10-CM | POA: Diagnosis not present

## 2021-01-30 DIAGNOSIS — I129 Hypertensive chronic kidney disease with stage 1 through stage 4 chronic kidney disease, or unspecified chronic kidney disease: Secondary | ICD-10-CM | POA: Diagnosis not present

## 2021-06-02 DIAGNOSIS — Z01419 Encounter for gynecological examination (general) (routine) without abnormal findings: Secondary | ICD-10-CM | POA: Diagnosis not present

## 2021-06-02 DIAGNOSIS — Z6824 Body mass index (BMI) 24.0-24.9, adult: Secondary | ICD-10-CM | POA: Diagnosis not present

## 2021-06-02 DIAGNOSIS — Z1231 Encounter for screening mammogram for malignant neoplasm of breast: Secondary | ICD-10-CM | POA: Diagnosis not present

## 2021-08-05 ENCOUNTER — Other Ambulatory Visit: Payer: Self-pay | Admitting: Family Medicine

## 2021-08-05 ENCOUNTER — Ambulatory Visit
Admission: RE | Admit: 2021-08-05 | Discharge: 2021-08-05 | Disposition: A | Discharge status: Home / Self Care | Payer: BC Managed Care – PPO | Source: Ambulatory Visit | Attending: Family Medicine | Admitting: Family Medicine

## 2021-08-05 DIAGNOSIS — M79671 Pain in right foot: Secondary | ICD-10-CM

## 2021-08-05 DIAGNOSIS — I129 Hypertensive chronic kidney disease with stage 1 through stage 4 chronic kidney disease, or unspecified chronic kidney disease: Secondary | ICD-10-CM | POA: Diagnosis not present

## 2021-08-05 DIAGNOSIS — N183 Chronic kidney disease, stage 3 unspecified: Secondary | ICD-10-CM | POA: Diagnosis not present

## 2021-08-05 DIAGNOSIS — F900 Attention-deficit hyperactivity disorder, predominantly inattentive type: Secondary | ICD-10-CM | POA: Diagnosis not present

## 2021-08-05 DIAGNOSIS — F439 Reaction to severe stress, unspecified: Secondary | ICD-10-CM | POA: Diagnosis not present

## 2021-08-12 DIAGNOSIS — M205X2 Other deformities of toe(s) (acquired), left foot: Secondary | ICD-10-CM | POA: Diagnosis not present

## 2021-08-26 DIAGNOSIS — D225 Melanocytic nevi of trunk: Secondary | ICD-10-CM | POA: Diagnosis not present

## 2021-09-15 DIAGNOSIS — M205X2 Other deformities of toe(s) (acquired), left foot: Secondary | ICD-10-CM | POA: Diagnosis not present

## 2021-09-15 DIAGNOSIS — M205X1 Other deformities of toe(s) (acquired), right foot: Secondary | ICD-10-CM | POA: Diagnosis not present

## 2021-10-13 DIAGNOSIS — M205X1 Other deformities of toe(s) (acquired), right foot: Secondary | ICD-10-CM | POA: Diagnosis not present

## 2021-10-13 DIAGNOSIS — M205X2 Other deformities of toe(s) (acquired), left foot: Secondary | ICD-10-CM | POA: Diagnosis not present

## 2021-11-24 DIAGNOSIS — U071 COVID-19: Secondary | ICD-10-CM | POA: Diagnosis not present

## 2022-01-26 DIAGNOSIS — M205X2 Other deformities of toe(s) (acquired), left foot: Secondary | ICD-10-CM | POA: Diagnosis not present

## 2022-02-04 DIAGNOSIS — N183 Chronic kidney disease, stage 3 unspecified: Secondary | ICD-10-CM | POA: Diagnosis not present

## 2022-02-04 DIAGNOSIS — F900 Attention-deficit hyperactivity disorder, predominantly inattentive type: Secondary | ICD-10-CM | POA: Diagnosis not present

## 2022-02-04 DIAGNOSIS — I129 Hypertensive chronic kidney disease with stage 1 through stage 4 chronic kidney disease, or unspecified chronic kidney disease: Secondary | ICD-10-CM | POA: Diagnosis not present

## 2022-02-04 DIAGNOSIS — Z23 Encounter for immunization: Secondary | ICD-10-CM | POA: Diagnosis not present

## 2022-02-04 DIAGNOSIS — F439 Reaction to severe stress, unspecified: Secondary | ICD-10-CM | POA: Diagnosis not present

## 2022-02-08 DIAGNOSIS — I129 Hypertensive chronic kidney disease with stage 1 through stage 4 chronic kidney disease, or unspecified chronic kidney disease: Secondary | ICD-10-CM | POA: Diagnosis not present

## 2022-02-08 DIAGNOSIS — N183 Chronic kidney disease, stage 3 unspecified: Secondary | ICD-10-CM | POA: Diagnosis not present

## 2022-03-23 DIAGNOSIS — M205X2 Other deformities of toe(s) (acquired), left foot: Secondary | ICD-10-CM | POA: Diagnosis not present

## 2022-04-14 DIAGNOSIS — H938X3 Other specified disorders of ear, bilateral: Secondary | ICD-10-CM | POA: Diagnosis not present

## 2022-04-14 DIAGNOSIS — J029 Acute pharyngitis, unspecified: Secondary | ICD-10-CM | POA: Diagnosis not present

## 2022-04-14 DIAGNOSIS — J0100 Acute maxillary sinusitis, unspecified: Secondary | ICD-10-CM | POA: Diagnosis not present

## 2022-06-03 DIAGNOSIS — Z6823 Body mass index (BMI) 23.0-23.9, adult: Secondary | ICD-10-CM | POA: Diagnosis not present

## 2022-06-03 DIAGNOSIS — Z124 Encounter for screening for malignant neoplasm of cervix: Secondary | ICD-10-CM | POA: Diagnosis not present

## 2022-06-03 DIAGNOSIS — R319 Hematuria, unspecified: Secondary | ICD-10-CM | POA: Diagnosis not present

## 2022-06-03 DIAGNOSIS — Z1151 Encounter for screening for human papillomavirus (HPV): Secondary | ICD-10-CM | POA: Diagnosis not present

## 2022-06-03 DIAGNOSIS — Z01419 Encounter for gynecological examination (general) (routine) without abnormal findings: Secondary | ICD-10-CM | POA: Diagnosis not present

## 2022-06-03 DIAGNOSIS — Z1231 Encounter for screening mammogram for malignant neoplasm of breast: Secondary | ICD-10-CM | POA: Diagnosis not present

## 2022-06-07 DIAGNOSIS — I129 Hypertensive chronic kidney disease with stage 1 through stage 4 chronic kidney disease, or unspecified chronic kidney disease: Secondary | ICD-10-CM | POA: Diagnosis not present

## 2022-06-07 DIAGNOSIS — R0789 Other chest pain: Secondary | ICD-10-CM | POA: Diagnosis not present

## 2022-06-07 DIAGNOSIS — N183 Chronic kidney disease, stage 3 unspecified: Secondary | ICD-10-CM | POA: Diagnosis not present

## 2022-08-06 DIAGNOSIS — F900 Attention-deficit hyperactivity disorder, predominantly inattentive type: Secondary | ICD-10-CM | POA: Diagnosis not present

## 2022-08-06 DIAGNOSIS — F439 Reaction to severe stress, unspecified: Secondary | ICD-10-CM | POA: Diagnosis not present

## 2022-08-06 DIAGNOSIS — I129 Hypertensive chronic kidney disease with stage 1 through stage 4 chronic kidney disease, or unspecified chronic kidney disease: Secondary | ICD-10-CM | POA: Diagnosis not present

## 2022-08-06 DIAGNOSIS — N183 Chronic kidney disease, stage 3 unspecified: Secondary | ICD-10-CM | POA: Diagnosis not present

## 2022-08-24 DIAGNOSIS — Z13820 Encounter for screening for osteoporosis: Secondary | ICD-10-CM | POA: Diagnosis not present

## 2022-09-07 DIAGNOSIS — Z1283 Encounter for screening for malignant neoplasm of skin: Secondary | ICD-10-CM | POA: Diagnosis not present

## 2022-09-07 DIAGNOSIS — D485 Neoplasm of uncertain behavior of skin: Secondary | ICD-10-CM | POA: Diagnosis not present

## 2022-09-07 DIAGNOSIS — D225 Melanocytic nevi of trunk: Secondary | ICD-10-CM | POA: Diagnosis not present

## 2022-09-27 DIAGNOSIS — M8580 Other specified disorders of bone density and structure, unspecified site: Secondary | ICD-10-CM | POA: Diagnosis not present

## 2022-12-14 DIAGNOSIS — D225 Melanocytic nevi of trunk: Secondary | ICD-10-CM | POA: Diagnosis not present

## 2022-12-14 DIAGNOSIS — Z1283 Encounter for screening for malignant neoplasm of skin: Secondary | ICD-10-CM | POA: Diagnosis not present

## 2022-12-14 DIAGNOSIS — D485 Neoplasm of uncertain behavior of skin: Secondary | ICD-10-CM | POA: Diagnosis not present

## 2022-12-14 DIAGNOSIS — D2271 Melanocytic nevi of right lower limb, including hip: Secondary | ICD-10-CM | POA: Diagnosis not present

## 2023-02-11 DIAGNOSIS — F900 Attention-deficit hyperactivity disorder, predominantly inattentive type: Secondary | ICD-10-CM | POA: Diagnosis not present

## 2023-02-11 DIAGNOSIS — Z23 Encounter for immunization: Secondary | ICD-10-CM | POA: Diagnosis not present

## 2023-02-11 DIAGNOSIS — N183 Chronic kidney disease, stage 3 unspecified: Secondary | ICD-10-CM | POA: Diagnosis not present

## 2023-02-11 DIAGNOSIS — I129 Hypertensive chronic kidney disease with stage 1 through stage 4 chronic kidney disease, or unspecified chronic kidney disease: Secondary | ICD-10-CM | POA: Diagnosis not present

## 2023-02-11 DIAGNOSIS — F439 Reaction to severe stress, unspecified: Secondary | ICD-10-CM | POA: Diagnosis not present

## 2023-02-11 DIAGNOSIS — E785 Hyperlipidemia, unspecified: Secondary | ICD-10-CM | POA: Diagnosis not present

## 2023-05-13 DIAGNOSIS — D2271 Melanocytic nevi of right lower limb, including hip: Secondary | ICD-10-CM | POA: Diagnosis not present

## 2023-08-12 IMAGING — CR DG FOOT 2V*R*
2 series · 2 of 2 positions shown · non-contrast
Comparison: None.

CLINICAL DATA: Right foot pain.

EXAM:
RIGHT FOOT - 2 VIEW

[x foot ap right]
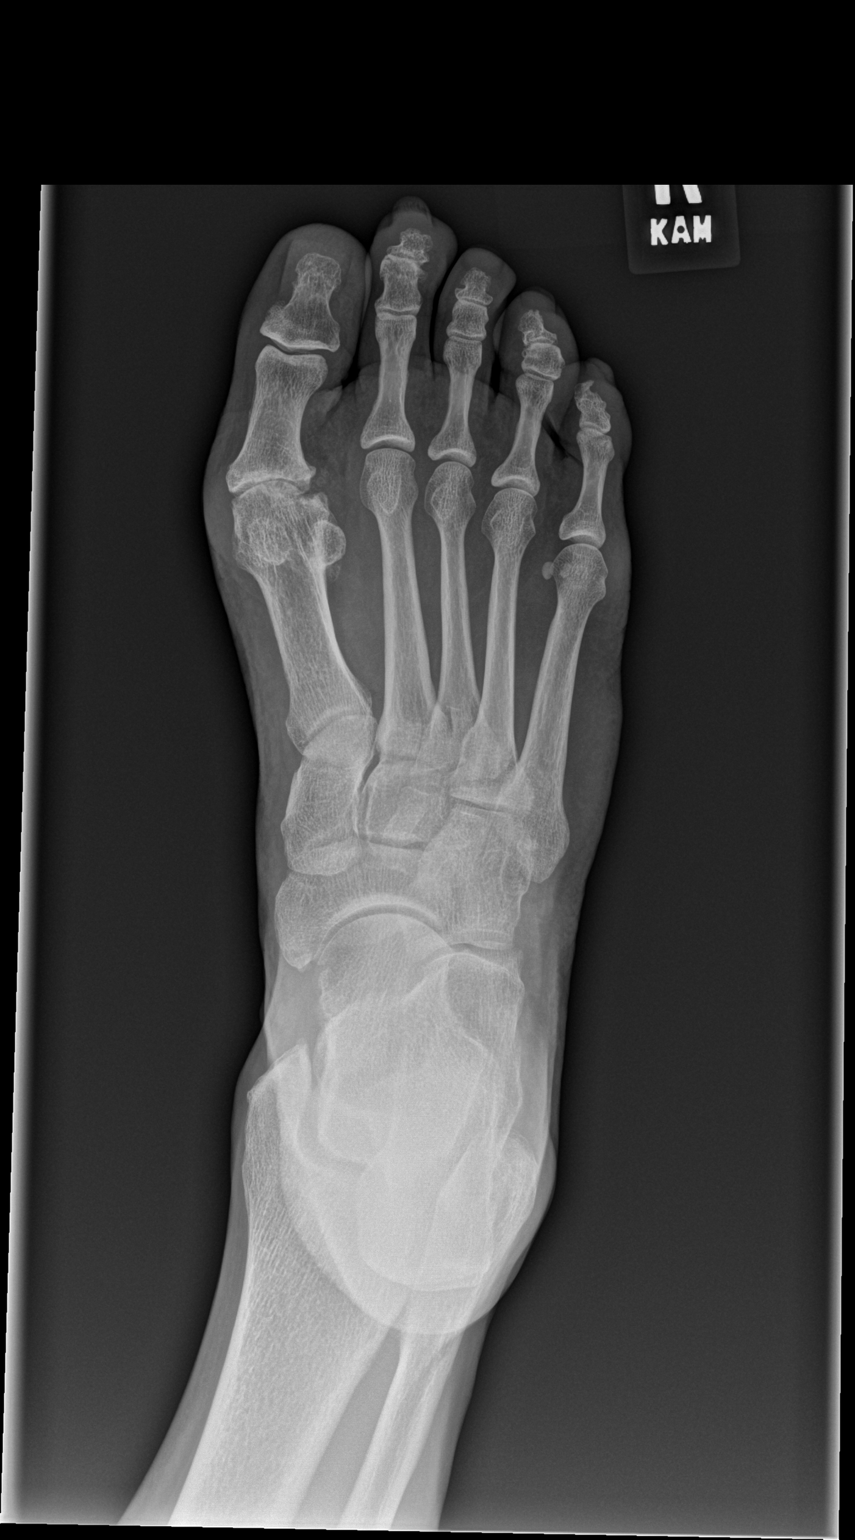

[x foot lat right]
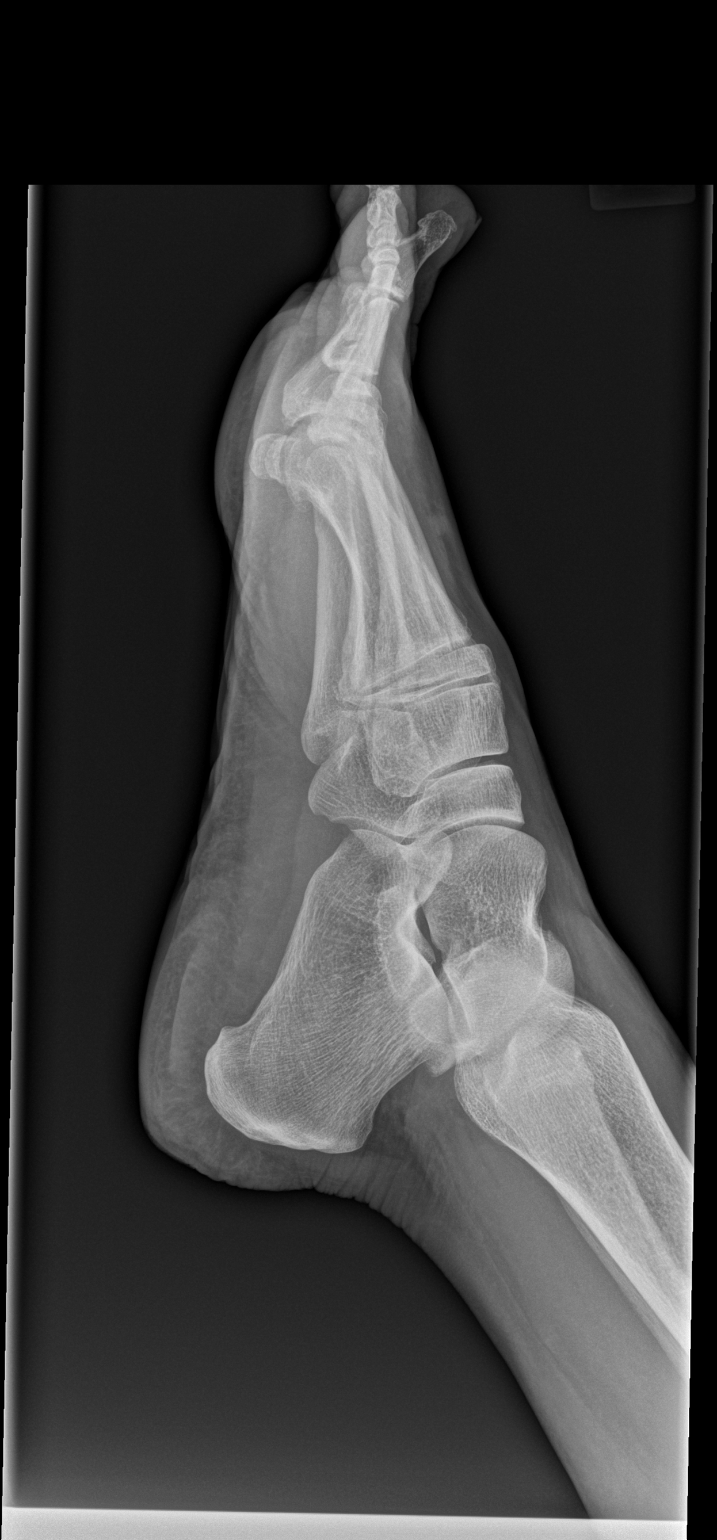

[2 of 2 positions shown; findings below may reference images not displayed]

FINDINGS: There is no evidence of an acute fracture or dislocation. There is
no evidence of arthropathy or other focal bone abnormality. Soft
tissues are unremarkable.
IMPRESSION: Negative.

## 2023-08-15 DIAGNOSIS — N183 Chronic kidney disease, stage 3 unspecified: Secondary | ICD-10-CM | POA: Diagnosis not present

## 2023-08-15 DIAGNOSIS — F439 Reaction to severe stress, unspecified: Secondary | ICD-10-CM | POA: Diagnosis not present

## 2023-08-15 DIAGNOSIS — I129 Hypertensive chronic kidney disease with stage 1 through stage 4 chronic kidney disease, or unspecified chronic kidney disease: Secondary | ICD-10-CM | POA: Diagnosis not present

## 2023-08-15 DIAGNOSIS — F900 Attention-deficit hyperactivity disorder, predominantly inattentive type: Secondary | ICD-10-CM | POA: Diagnosis not present

## 2023-09-06 DIAGNOSIS — Z1231 Encounter for screening mammogram for malignant neoplasm of breast: Secondary | ICD-10-CM | POA: Diagnosis not present

## 2023-09-06 DIAGNOSIS — Z87448 Personal history of other diseases of urinary system: Secondary | ICD-10-CM | POA: Diagnosis not present

## 2023-09-06 DIAGNOSIS — Z01419 Encounter for gynecological examination (general) (routine) without abnormal findings: Secondary | ICD-10-CM | POA: Diagnosis not present

## 2023-09-06 DIAGNOSIS — Z6823 Body mass index (BMI) 23.0-23.9, adult: Secondary | ICD-10-CM | POA: Diagnosis not present

## 2023-09-13 DIAGNOSIS — L821 Other seborrheic keratosis: Secondary | ICD-10-CM | POA: Diagnosis not present

## 2023-09-13 DIAGNOSIS — D2271 Melanocytic nevi of right lower limb, including hip: Secondary | ICD-10-CM | POA: Diagnosis not present

## 2023-09-13 DIAGNOSIS — B078 Other viral warts: Secondary | ICD-10-CM | POA: Diagnosis not present

## 2023-10-11 ENCOUNTER — Encounter: Payer: Self-pay | Admitting: Internal Medicine

## 2023-10-17 ENCOUNTER — Encounter: Payer: Self-pay | Admitting: Internal Medicine

## 2023-10-17 ENCOUNTER — Ambulatory Visit (AMBULATORY_SURGERY_CENTER)

## 2023-10-17 ENCOUNTER — Telehealth: Payer: Self-pay

## 2023-10-17 VITALS — Ht 69.0 in | Wt 149.0 lb

## 2023-10-17 DIAGNOSIS — Z1211 Encounter for screening for malignant neoplasm of colon: Secondary | ICD-10-CM

## 2023-10-17 MED ORDER — NA SULFATE-K SULFATE-MG SULF 17.5-3.13-1.6 GM/177ML PO SOLN: 1.0000 | Freq: Once | ORAL | 0 refills | Status: AC

## 2023-10-17 NOTE — Telephone Encounter (Signed)
 Pv in progress

## 2023-10-17 NOTE — Progress Notes (Signed)

## 2023-11-07 ENCOUNTER — Encounter: Payer: Self-pay | Admitting: Internal Medicine

## 2023-11-07 ENCOUNTER — Telehealth: Payer: Self-pay | Admitting: Internal Medicine

## 2023-11-07 ENCOUNTER — Ambulatory Visit (AMBULATORY_SURGERY_CENTER): Admitting: Internal Medicine

## 2023-11-07 VITALS — BP 133/72 | HR 70 | Temp 98.2°F | Resp 19 | Ht 69.0 in | Wt 149.0 lb

## 2023-11-07 DIAGNOSIS — D128 Benign neoplasm of rectum: Secondary | ICD-10-CM

## 2023-11-07 DIAGNOSIS — K642 Third degree hemorrhoids: Secondary | ICD-10-CM | POA: Diagnosis not present

## 2023-11-07 DIAGNOSIS — Z1211 Encounter for screening for malignant neoplasm of colon: Secondary | ICD-10-CM

## 2023-11-07 MED ORDER — HYDROCORTISONE (PERIANAL) 2.5 % EX CREA: 1.0000 | TOPICAL_CREAM | Freq: Two times a day (BID) | CUTANEOUS | 0 refills | Status: AC

## 2023-11-07 MED ORDER — SODIUM CHLORIDE 0.9 % IV SOLN: 500.0000 mL | Freq: Once | INTRAVENOUS | Status: DC

## 2023-11-07 NOTE — Progress Notes (Signed)
 GASTROENTEROLOGY PROCEDURE H&P NOTE   Primary Care Physician: Teresa Channel, MD    Reason for Procedure:   Colon cancer screening  Plan:    Colonoscopy  Patient is appropriate for endoscopic procedure(s) in the ambulatory ([facility]) setting.  The nature of the procedure, as well as the risks, benefits, and alternatives were carefully and thoroughly reviewed with the patient. Ample time for discussion and questions allowed. The patient understood, was satisfied, and agreed to proceed.     HPI: Mary Rich is a 63 y.o. female who presents for colonoscopy for colon cancer screening. Denies blood in stools, changes in bowel habits, or unintentional weight loss. Denies family history of colon cancer. Last colonoscopy in 2015 with small internal hemorrhoids.   Past Medical History:  Diagnosis Date   Attention deficit disorder    Chest pain    IVE HAD SOME PLEURISY AT RANDOM OVER THE LAST 10 YEARS, USUALLY WHENI HAVE A BAD UPPER RESPIRATORY INFECTION    Hypertension    PONV (postoperative nausea and vomiting)    LAPARASCOPY YEARS AGO CAUSED NAUSEA     Past Surgical History:  Procedure Laterality Date   LAPAROSCOPY     ABDOMINAL FOR EVALOF POTENTIAL ECTOPIC PREGNANCY    TOTAL HIP ARTHROPLASTY Right 05/10/2017   Procedure: RIGHT TOTAL HIP ARTHROPLASTY ANTERIOR APPROACH;  Surgeon: Ernie Cough, MD;  Location: [facility];  Service: Orthopedics;  Laterality: Right;  70 mins   WISDOM TOOTH EXTRACTION  1982   x4    Prior to Admission medications   Medication Sig Start Date End Date Taking? Authorizing Provider  amphetamine -dextroamphetamine  (ADDERALL XR) 30 MG 24 hr capsule Take 30 mg by mouth daily.   Yes [provider]  b complex vitamins capsule Take 1 capsule by mouth daily.   Yes [provider]  Calcium Citrate-Vitamin D (CALCIUM + D PO) Take 1 tablet by mouth daily.   Yes [provider]  cholecalciferol (VITAMIN D) 1000 units tablet Take 1,000  Units by mouth daily.   Yes [provider]  lisinopril (PRINIVIL,ZESTRIL) 5 MG tablet Take 5 mg by mouth daily.   Yes [provider]  Multiple Vitamin (MULTIVITAMIN) tablet Take 1 tablet by mouth daily.   Yes [provider]  acetaminophen  (TYLENOL ) 500 MG tablet Take 2 tablets (1,000 mg total) by mouth every 8 (eight) hours. Patient taking differently: Take 1,000 mg by mouth every 8 (eight) hours as needed. 05/11/17   Danella Cough, PA-C  amphetamine -dextroamphetamine  (ADDERALL) 10 MG tablet Take 10 to 20 mgs in the afternoon as needed for concentration Patient not taking: No sig reported    [provider]    Current Outpatient Medications  Medication Sig Dispense Refill   amphetamine -dextroamphetamine  (ADDERALL XR) 30 MG 24 hr capsule Take 30 mg by mouth daily.     b complex vitamins capsule Take 1 capsule by mouth daily.     Calcium Citrate-Vitamin D (CALCIUM + D PO) Take 1 tablet by mouth daily.     cholecalciferol (VITAMIN D) 1000 units tablet Take 1,000 Units by mouth daily.     lisinopril (PRINIVIL,ZESTRIL) 5 MG tablet Take 5 mg by mouth daily.     Multiple Vitamin (MULTIVITAMIN) tablet Take 1 tablet by mouth daily.     acetaminophen  (TYLENOL ) 500 MG tablet Take 2 tablets (1,000 mg total) by mouth every 8 (eight) hours. (Patient taking differently: Take 1,000 mg by mouth every 8 (eight) hours as needed.) 30 tablet 0   amphetamine -dextroamphetamine  (ADDERALL)  10 MG tablet Take 10 to 20 mgs in the afternoon as needed for concentration (Patient not taking: No sig reported)     Current Facility-Administered Medications  Medication Dose Route Frequency Provider Last Rate Last Admin   0.9 %  sodium chloride  infusion  500 mL Intravenous Once Federico Rosario BROCKS, MD        Allergies as of 11/07/2023 - Review Complete 11/07/2023  Allergen Reaction Noted   Latex Itching 04/27/2017   Penicillins Hives 09/22/2011    Family History  Problem Relation Age of  Onset   Diabetes Mother    Hypertension Mother    Dementia Mother    Heart disease Mother        s/p CABG   Pulmonary embolism Father    Stroke Maternal Grandfather    Crohn's disease Daughter    Multiple sclerosis Son    Colon cancer Neg Hx    Stomach cancer Neg Hx    Rectal cancer Neg Hx    Colon polyps Neg Hx    Esophageal cancer Neg Hx     Social History   Socioeconomic History   Marital status: Married    Spouse name: Not on file   Number of children: 2   Years of education: Not on file   Highest education level: Not on file  Occupational History   Not on file  Tobacco Use   Smoking status: Never   Smokeless tobacco: Never  Substance and Sexual Activity   Alcohol use: No   Drug use: No   Sexual activity: Not on file  Other Topics Concern   Not on file  Social History Narrative   Not on file   Social Drivers of Health   Financial Resource Strain: Not on file  Food Insecurity: Not on file  Transportation Needs: Not on file  Physical Activity: Not on file  Stress: Not on file  Social Connections: Not on file  Intimate Partner Violence: Not on file    Physical Exam: Vital signs in last 24 hours: BP (!) 144/70   Pulse 86   Temp 98.2 F (36.8 C)   Ht 5' 9 (1.753 m)   Wt 149 lb (67.6 kg)   LMP 06/16/2013   SpO2 99%   BMI 22.00 kg/m  GEN: NAD EYE: Sclerae anicteric ENT: MMM CV: Non-tachycardic Pulm: No increased work of breathing GI: Soft, NT/ND NEURO:  Alert & Oriented   Estefana Federico, MD [facility]  11/07/2023 9:01 AM

## 2023-11-07 NOTE — Op Note (Addendum)
 [facility] Patient Name: Mary Rich Procedure Date: 11/07/2023 8:56 AM MRN: [id number] Endoscopist: Rosario Estefana Kidney , , [id number] Age: 63 Referring MD:  Date of Birth: 26-Jul-1960 Gender: Female Account #: [id number] Procedure:                Colonoscopy Indications:              Screening for colorectal malignant neoplasm Medicines:                Monitored Anesthesia Care Procedure:                Pre-Anesthesia Assessment:                           - Prior to the procedure, a History and Physical                            was performed, and patient medications and                            allergies were reviewed. The patient's tolerance of                            previous anesthesia was also reviewed. The risks                            and benefits of the procedure and the sedation                            options and risks were discussed with the patient.                            All questions were answered, and informed consent                            was obtained. Prior Anticoagulants: The patient has                            taken no anticoagulant or antiplatelet agents. ASA                            Grade Assessment: II - A patient with mild systemic                            disease. After reviewing the risks and benefits,                            the patient was deemed in satisfactory condition to                            undergo the procedure.                           After obtaining informed consent, the colonoscope  was passed under direct vision. Throughout the                            procedure, the patient's blood pressure, pulse, and                            oxygen saturations were monitored continuously. The                            Olympus Scope SN [id number] was introduced through the                            anus and advanced to the the terminal ileum. The                             colonoscopy was performed without difficulty. The                            patient tolerated the procedure well. The quality                            of the bowel preparation was excellent. The                            terminal ileum, ileocecal valve, appendiceal                            orifice, and rectum were photographed. Scope In: 9:08:43 AM Scope Out: 9:24:16 AM Scope Withdrawal Time: 0 hours 11 minutes 35 seconds  Total Procedure Duration: 0 hours 15 minutes 33 seconds  Findings:                 The terminal ileum appeared normal.                           Two sessile polyps were found in the rectum. The                            polyps were 4 to 5 mm in size. These polyps were                            removed with a cold snare. Resection and retrieval                            were complete.                           Non-bleeding internal hemorrhoids were found during                            retroflexion. The hemorrhoids were Grade III                            (internal hemorrhoids that prolapse but require  manual reduction). Complications:            No immediate complications. Estimated Blood Loss:     Estimated blood loss was minimal. Impression:               - The examined portion of the ileum was normal.                           - Two 4 to 5 mm polyps in the rectum, removed with                            a cold snare. Resected and retrieved.                           - Non-bleeding internal hemorrhoids. Recommendation:           - Discharge patient to home (with escort).                           - Await pathology results.                           - Anusol  HC cream BID for 7 days.                           - The findings and recommendations were discussed                            with the patient. Dr Estefana Federico Rosario Estefana Federico,  11/07/2023 9:29:25 AM

## 2023-11-07 NOTE — Progress Notes (Signed)
To Pacu, VSS. Report to Rn.tb 

## 2023-11-07 NOTE — Telephone Encounter (Signed)
 Called pt to discuss MD recommendations for hydrocortisone  cream prescribed. No answer. Left detailed message about [employer]  and price at [facility]. Instructed pt to call back with any further concerns.

## 2023-11-07 NOTE — Telephone Encounter (Signed)
 Inbound call from patient states her insurance does not cover hydrocortisone  medication. Please advise. Thank you

## 2023-11-07 NOTE — Progress Notes (Signed)
 Pt's states no medical or surgical changes since previsit or office visit.

## 2023-11-07 NOTE — Telephone Encounter (Signed)
 Dr. Federico please see previous not regarding hydrocortisone  prescribed for this pt today.

## 2023-11-07 NOTE — Patient Instructions (Signed)
 Resume previous diet and medications. Awaiting pathology results. Repeat Colonoscopy date to be determined based on pathology results. Handouts provided on colon polyps and Hemorrhoids  YOU HAD AN ENDOSCOPIC PROCEDURE TODAY AT THE [facility]:   Refer to the procedure report that was given to you for any specific questions about what was found during the examination.  If the procedure report does not answer your questions, please call your gastroenterologist to clarify.  If you requested that your care partner not be given the details of your procedure findings, then the procedure report has been included in a sealed envelope for you to review at your convenience later.  YOU SHOULD EXPECT: Some feelings of bloating in the abdomen. Passage of more gas than usual.  Walking can help get rid of the air that was put into your GI tract during the procedure and reduce the bloating. If you had a lower endoscopy (such as a colonoscopy or flexible sigmoidoscopy) you may notice spotting of blood in your stool or on the toilet paper. If you underwent a bowel prep for your procedure, you may not have a normal bowel movement for a few days.  Please Note:  You might notice some irritation and congestion in your nose or some drainage.  This is from the oxygen used during your procedure.  There is no need for concern and it should clear up in a day or so.  SYMPTOMS TO REPORT IMMEDIATELY:  Following lower endoscopy (colonoscopy or flexible sigmoidoscopy):  Excessive amounts of blood in the stool  Significant tenderness or worsening of abdominal pains  Swelling of the abdomen that is new, acute  Fever of 100F or higher  For urgent or emergent issues, a gastroenterologist can be reached at any hour by calling [phone]. Do not use MyChart messaging for urgent concerns.    DIET:  We do recommend a small meal at first, but then you may proceed to your regular diet.  Drink plenty of fluids but you  should avoid alcoholic beverages for 24 hours.  ACTIVITY:  You should plan to take it easy for the rest of today and you should NOT DRIVE or use heavy machinery until tomorrow (because of the sedation medicines used during the test).    FOLLOW UP: Our staff will call the number listed on your records the next business day following your procedure.  We will call around 7:15- 8:00 am to check on you and address any questions or concerns that you may have regarding the information given to you following your procedure. If we do not reach you, we will leave a message.     If any biopsies were taken you will be contacted by phone or by letter within the next 1-3 weeks.  Please call us at [phone] if you have not heard about the biopsies in 3 weeks.    SIGNATURES/CONFIDENTIALITY: You and/or your care partner have signed paperwork which will be entered into your electronic medical record.  These signatures attest to the fact that that the information above on your After Visit Summary has been reviewed and is understood.  Full responsibility of the confidentiality of this discharge information lies with you and/or your care-partner.

## 2023-11-08 ENCOUNTER — Telehealth: Payer: Self-pay

## 2023-11-08 NOTE — Telephone Encounter (Signed)
  Follow up Call-     11/07/2023    7:56 AM  Call back number  Post procedure Call Back phone  # [phone]  Permission to leave phone message Yes     Patient questions:  Do you have a fever, pain , or abdominal swelling? No. Pain Score  0 *  Have you tolerated food without any problems? Yes.    Have you been able to return to your normal activities? Yes.    Do you have any questions about your discharge instructions: Diet   No. Medications  No. Follow up visit  No.  Do you have questions or concerns about your Care? No.  Actions: * If pain score is 4 or above: No action needed, pain <4.

## 2023-11-09 LAB — SURGICAL PATHOLOGY

## 2023-11-10 ENCOUNTER — Ambulatory Visit: Payer: Self-pay | Admitting: Internal Medicine

## 2023-12-16 DIAGNOSIS — Z1283 Encounter for screening for malignant neoplasm of skin: Secondary | ICD-10-CM | POA: Diagnosis not present

## 2023-12-16 DIAGNOSIS — D2271 Melanocytic nevi of right lower limb, including hip: Secondary | ICD-10-CM | POA: Diagnosis not present

## 2023-12-16 DIAGNOSIS — L820 Inflamed seborrheic keratosis: Secondary | ICD-10-CM | POA: Diagnosis not present

## 2023-12-16 DIAGNOSIS — D225 Melanocytic nevi of trunk: Secondary | ICD-10-CM | POA: Diagnosis not present

## 2024-02-15 DIAGNOSIS — F439 Reaction to severe stress, unspecified: Secondary | ICD-10-CM | POA: Diagnosis not present

## 2024-02-15 DIAGNOSIS — N183 Chronic kidney disease, stage 3 unspecified: Secondary | ICD-10-CM | POA: Diagnosis not present

## 2024-02-15 DIAGNOSIS — Z23 Encounter for immunization: Secondary | ICD-10-CM | POA: Diagnosis not present

## 2024-02-15 DIAGNOSIS — F900 Attention-deficit hyperactivity disorder, predominantly inattentive type: Secondary | ICD-10-CM | POA: Diagnosis not present

## 2024-02-15 DIAGNOSIS — I129 Hypertensive chronic kidney disease with stage 1 through stage 4 chronic kidney disease, or unspecified chronic kidney disease: Secondary | ICD-10-CM | POA: Diagnosis not present

## 2024-05-11 ENCOUNTER — Encounter (HOSPITAL_COMMUNITY): Payer: Self-pay

## 2024-05-11 ENCOUNTER — Other Ambulatory Visit: Payer: Self-pay

## 2024-05-11 ENCOUNTER — Emergency Department (HOSPITAL_COMMUNITY)
Admission: EM | Admit: 2024-05-11 | Discharge: 2024-05-11 | Disposition: A | Discharge status: Home / Self Care | Source: Ambulatory Visit | Attending: Emergency Medicine | Admitting: Emergency Medicine

## 2024-05-11 DIAGNOSIS — Z91040 Latex allergy status: Secondary | ICD-10-CM | POA: Diagnosis not present

## 2024-05-11 DIAGNOSIS — M7989 Other specified soft tissue disorders: Secondary | ICD-10-CM | POA: Insufficient documentation

## 2024-05-11 DIAGNOSIS — M79604 Pain in right leg: Secondary | ICD-10-CM | POA: Diagnosis present

## 2024-05-11 DIAGNOSIS — M79661 Pain in right lower leg: Secondary | ICD-10-CM

## 2024-05-11 MED ORDER — ENOXAPARIN SODIUM 80 MG/0.8ML IJ SOSY
1.0000 mg/kg | PREFILLED_SYRINGE | Freq: Once | INTRAMUSCULAR | Status: AC
  Administered 2024-05-11: 67.5 mg via SUBCUTANEOUS
  Filled 2024-05-11: qty 0.68

## 2024-05-11 NOTE — ED Triage Notes (Signed)
 Pt has had intermittent right calf pain x 1 week with worsening today. Pt denies swelling and/or warmth of skin. PMD performed D-Dimer today and told her that it was slightly elevated and that she needed to come to ED to rule out blood clot. Denies CP/SOB

## 2024-05-11 NOTE — Discharge Instructions (Signed)
 You can go to [facility] off of [address].  He can go to the front desk and tell them that you were seen at [facility] emergency room last evening and you are here today for a DVT study.  The order has been placed.

## 2024-05-11 NOTE — ED Provider Notes (Signed)
 " [facility]GENC[facility] DEPARTMENT AT [facility] Provider Note   CSN: [id number] Arrival date & time: 05/11/24  2101     Patient presents with: Leg Pain   Mary Rich is a 64 y.o. female.   64 year old female here today because she has had pain and swelling in her right leg for 1 week.  She thought she pulled a muscle.  Her primary care doctor did a outpatient D-dimer, called her when her D-dimer was 0.7 and told her to come to the ED.  We do not have ultrasound available this evening.   Leg Pain      Prior to Admission medications  Medication Sig Start Date End Date Taking? Authorizing Provider  acetaminophen  (TYLENOL ) 500 MG tablet Take 2 tablets (1,000 mg total) by mouth every 8 (eight) hours. Patient taking differently: Take 1,000 mg by mouth every 8 (eight) hours as needed. 05/11/17   Danella Cough, PA-C  amphetamine -dextroamphetamine  (ADDERALL XR) 30 MG 24 hr capsule Take 30 mg by mouth daily.    [provider]  amphetamine -dextroamphetamine  (ADDERALL) 10 MG tablet Take 10 to 20 mgs in the afternoon as needed for concentration Patient not taking: No sig reported    [provider]  b complex vitamins capsule Take 1 capsule by mouth daily.    [provider]  Calcium Citrate-Vitamin D (CALCIUM + D PO) Take 1 tablet by mouth daily.    [provider]  cholecalciferol (VITAMIN D) 1000 units tablet Take 1,000 Units by mouth daily.    [provider]  lisinopril (PRINIVIL,ZESTRIL) 5 MG tablet Take 5 mg by mouth daily.    [provider]  Multiple Vitamin (MULTIVITAMIN) tablet Take 1 tablet by mouth daily.    [provider]    Allergies: Latex and Penicillins    Review of Systems  Updated Vital Signs BP (!) 144/75 (BP Location: Left Arm)   Pulse 91   Temp 97.9 F (36.6 C) (Oral)   Resp 18   LMP 06/16/2013   SpO2 95%   Physical Exam Vitals and nursing note reviewed.  Cardiovascular:      Pulses: Normal pulses.  Musculoskeletal:        General: No swelling or deformity. Normal range of motion.     (all labs ordered are listed, but only abnormal results are displayed) Labs Reviewed - No data to display  EKG: None  Radiology: No results found.   Procedures   Medications Ordered in the ED  enoxaparin (LOVENOX) injection 67.5 mg (has no administration in time range)                                    Medical Decision Making 64 year old female here today for right calf pain.  Plan-no swelling,, no cellulitis, normal pulses.  Likely musculoskeletal, however cannot exclude DVT.  Will provide patient dose of Lovenox.  She is going to go to [facility] tomorrow for formal DVT ultrasound.  Order has been placed.  Risk Prescription drug management.        Final diagnoses:  Right calf pain    ED Discharge Orders          Ordered    LE Venous       Comments: IMPORTANT PATIENT INSTRUCTIONS: You have been scheduled for an Outpatient Vascular Study at [facility].  If tomorrow is a Saturday, Sunday or holiday, please go to  the [facility] Emergency Department Registration Desk at 11 am tomorrow morning and tell them you are there for a vascular study.  If tomorrow is a weekday (Monday-Friday), please go to the [facility] (address [address], [place]) at 8 am and report to the 4th floor registration Zone A.  Inform registration that you are there for a vascular study.   05/11/24 2126               Mannie Pac T, DO 05/11/24 2127  "

## 2024-05-12 ENCOUNTER — Ambulatory Visit (HOSPITAL_COMMUNITY)
Admission: RE | Admit: 2024-05-12 | Discharge: 2024-05-12 | Disposition: A | Source: Ambulatory Visit | Attending: Emergency Medicine

## 2024-05-12 DIAGNOSIS — M7989 Other specified soft tissue disorders: Secondary | ICD-10-CM

## 2024-05-12 DIAGNOSIS — R7989 Other specified abnormal findings of blood chemistry: Secondary | ICD-10-CM | POA: Insufficient documentation

## 2024-05-12 DIAGNOSIS — M79669 Pain in unspecified lower leg: Secondary | ICD-10-CM | POA: Insufficient documentation

## 2024-05-12 NOTE — Progress Notes (Signed)
 VASCULAR LAB    Right lower extremity venous duplex has been performed.  See CV proc for preliminary results.   Raylyn Carton, RVT 05/12/2024, 11:49 AM
